# Patient Record
Sex: Male | Born: 1955 | Race: White | Hispanic: No | Marital: Married | Smoking: Former smoker
Health system: Southern US, Community
[De-identification: ages and names within clinical notes are randomized; demographics above are authoritative.]

## PROBLEM LIST (undated history)

## (undated) DIAGNOSIS — D759 Disease of blood and blood-forming organs, unspecified: Secondary | ICD-10-CM

## (undated) DIAGNOSIS — M1990 Unspecified osteoarthritis, unspecified site: Secondary | ICD-10-CM

## (undated) DIAGNOSIS — K219 Gastro-esophageal reflux disease without esophagitis: Secondary | ICD-10-CM

## (undated) DIAGNOSIS — Z5189 Encounter for other specified aftercare: Secondary | ICD-10-CM

## (undated) DIAGNOSIS — N189 Chronic kidney disease, unspecified: Secondary | ICD-10-CM

## (undated) DIAGNOSIS — C9000 Multiple myeloma not having achieved remission: Secondary | ICD-10-CM

## (undated) DIAGNOSIS — Z87442 Personal history of urinary calculi: Secondary | ICD-10-CM

## (undated) DIAGNOSIS — E7800 Pure hypercholesterolemia, unspecified: Secondary | ICD-10-CM

## (undated) DIAGNOSIS — C801 Malignant (primary) neoplasm, unspecified: Secondary | ICD-10-CM

## (undated) DIAGNOSIS — Z98811 Dental restoration status: Secondary | ICD-10-CM

## (undated) DIAGNOSIS — R200 Anesthesia of skin: Secondary | ICD-10-CM

## (undated) DIAGNOSIS — Z8719 Personal history of other diseases of the digestive system: Secondary | ICD-10-CM

## (undated) DIAGNOSIS — K5790 Diverticulosis of intestine, part unspecified, without perforation or abscess without bleeding: Secondary | ICD-10-CM

## (undated) HISTORY — PX: LUMBAR FUSION: SHX111

## (undated) HISTORY — DX: Multiple myeloma not having achieved remission: C90.00

## (undated) HISTORY — DX: Encounter for other specified aftercare: Z51.89

## (undated) HISTORY — PX: ROTATOR CUFF REPAIR: SHX139

## (undated) HISTORY — PX: ANTERIOR CRUCIATE LIGAMENT REPAIR: SHX115

## (undated) HISTORY — PX: BACK SURGERY: SHX140

## (undated) HISTORY — DX: Diverticulosis of intestine, part unspecified, without perforation or abscess without bleeding: K57.90

## (undated) HISTORY — PX: COLONOSCOPY: SHX174

## (undated) HISTORY — DX: Chronic kidney disease, unspecified: N18.9

---

## 1995-10-05 HISTORY — PX: CHOLECYSTECTOMY: SHX55

## 2006-10-04 HISTORY — PX: LUMBAR LAMINECTOMY: SHX95

## 2012-10-24 ENCOUNTER — Other Ambulatory Visit: Payer: Self-pay | Admitting: Neurosurgery

## 2012-11-28 ENCOUNTER — Encounter (HOSPITAL_COMMUNITY): Payer: Self-pay | Admitting: Pharmacy Technician

## 2012-12-01 ENCOUNTER — Encounter (HOSPITAL_COMMUNITY)
Admission: RE | Admit: 2012-12-01 | Discharge: 2012-12-01 | Disposition: A | Discharge status: Home / Self Care | Payer: BC Managed Care – PPO | Source: Ambulatory Visit | Attending: Neurosurgery | Admitting: Neurosurgery

## 2012-12-01 ENCOUNTER — Encounter (HOSPITAL_COMMUNITY): Payer: Self-pay

## 2012-12-01 HISTORY — DX: Dental restoration status: Z98.811

## 2012-12-01 HISTORY — DX: Gastro-esophageal reflux disease without esophagitis: K21.9

## 2012-12-01 HISTORY — DX: Anesthesia of skin: R20.0

## 2012-12-01 HISTORY — DX: Pure hypercholesterolemia, unspecified: E78.00

## 2012-12-01 LAB — BASIC METABOLIC PANEL
BUN: 12 mg/dL (ref 6–23)
Calcium: 9.6 mg/dL (ref 8.4–10.5)
Creatinine, Ser: 0.99 mg/dL (ref 0.50–1.35)
GFR calc non Af Amer: 88 mL/min — ABNORMAL LOW (ref >90)
Glucose, Bld: 83 mg/dL (ref 70–99)
Sodium: 143 mEq/L (ref 135–145)

## 2012-12-01 LAB — CBC
MCH: 33.3 pg (ref 26.0–34.0)
MCHC: 34.8 g/dL (ref 30.0–36.0)
MCV: 95.7 fL (ref 78.0–100.0)
Platelets: 163 10*3/uL (ref 150–400)
RDW: 12.8 % (ref 11.5–15.5)

## 2012-12-01 LAB — TYPE AND SCREEN: ABO/RH(D): O POS

## 2012-12-01 NOTE — Pre-Procedure Instructions (Signed)
[name]  12/01/2012   Your procedure is scheduled on:  Tuesday, March 11  Report to [facility] at 0530 AM.  Call this number if you have problems the morning of surgery: [phone]   Remember:   Do not eat food or drink liquids after midnight.Monday night   Take these medicines the morning of surgery with A SIP OF WATER: Ranitidine,Tramadol is needed   Do not wear jewelry, make-up or nail polish.  Do not wear lotions, powders, perfumes or deodorant.  Do not bring valuables to the hospital.  Contacts, dentures or bridgework may not be worn into surgery.  Leave suitcase in the car. After surgery it may be brought to your room.  For patients admitted to the hospital, checkout time is 11:00 AM the day of  discharge.   Special Instructions: Shower using CHG 2 nights before surgery and the night before surgery.  If you shower the day of surgery use CHG.  Use special wash - you have one bottle of CHG for all showers.  You should use approximately 1/3 of the bottle for each shower.   Please read over the following fact sheets that you were given: Pain Booklet, Coughing and Deep Breathing, Blood Transfusion Information and Surgical Site Infection Prevention

## 2012-12-01 NOTE — Progress Notes (Signed)
EKG requested from PCP Dr Romeo Rabon

## 2012-12-04 NOTE — Progress Notes (Signed)
Anesthesia chart review: Patient is a 57 year old male scheduled for L4-S1 decompression/fusion by Dr. [name] on 12/12/12. History includes nonsmoker, hypercholesterolemia, GERD, prior lumbar laminectomy, cholecystectomy, rotator cuff repair, dental crowns, known right bundle branch block.  PCP is Dr. Romeo Rabon.  Preoperative labs noted.  EKG from 02/18/12 (PCP) showed SB @ 49 bpm, right BBB (old, per notes).  Per PAT RN notes, he denied any prior cardiac testing.  No CV symptoms documented at his PAT appointment.  He has no known history of smoking, HTN, DM, or CAD.  HR was 76 bpm at PAT.  He will be evaluated by his assigned anesthesiologist on the day of surgery.  If no acute changes then would anticipate he could proceed as planned.   Shonna Chock, PA-C 12/04/12 1224

## 2012-12-08 ENCOUNTER — Other Ambulatory Visit (HOSPITAL_COMMUNITY): Payer: Self-pay

## 2012-12-11 MED ORDER — CEFAZOLIN SODIUM-DEXTROSE 2-3 GM-% IV SOLR
2.0000 g | INTRAVENOUS | Status: AC
  Administered 2012-12-12 (×2): 2 g via INTRAVENOUS
  Filled 2012-12-11: qty 50

## 2012-12-11 NOTE — H&P (Signed)
[name]  #[id number] DOB:  1955-11-06   10/23/2012:  [name] comes back today.  I spent greater than 25 minutes with the patient and then [name], RN went over the details of surgery and models, and answered questions in addition to my visit with him.    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  He has bilateral L5 pars defects.  He has Grade I anterolisthesis of L5 on S1, moderate right, milder left foraminal stenosis.  At L4-5 there is a right foraminal disc protrusion, moderate bilateral facet arthropathy, right greater than left lateral recess stenosis.  He has milder degenerative changes at other levels in his lumbar spine, but these are not significantly problematic.    He continues to have weakness in his right leg and also greater pain in his left leg.    I have recommended that he undergo decompression and fusion L4 through S1 levels.  He has significant weakness in his right leg involving extensor hallucis longus and hip abductor and this is unchanged.  He has mobile spondylolisthesis of L5 on S1 with Grade I to II spondylolisthesis with Grade I retrolisthesis of L4 on L5.  He has had previous laminectomy at L3-4, but this does not appear to be a problem and is not an active contributor to his current pain complaints.    He is not able to function and has tried exercises and physical therapy without significant relief of his discomfort.  At this point, I recommend that we proceed with decompression and fusion operation and have set this up for 12/12/2012.  Risks and benefits were discussed with the patient and he and his wife's questions were answered and they wish to proceed.   I went over the surgical procedure with the patient in great detail today.  I went over the diagnostic studies, as well as surgical models.  We discussed the potential risks and benefits of the surgery, as well as expected hospital course.  The  patient would generally wear a brace for three months after surgery.  The risks include, but are not limited to, bleeding, possible need for transfusion, infection, damage to nerves and vessels, risks of anesthesia, dural tear, injury to lumbar nerve roots causing either temporary or permanent leg pain, numbness or weakness, malpositioning of instrumentation, fusion failure, failure to relieve pain, back pain after surgery, recurrent disc herniation, worsening of pain and adjacent degeneration after a spinal fusion.  Also, the potential for the need for further surgery in the event of incomplete fusion.  We also discussed the risks of injury to abdominal structures including bowel, iliac artery or vein injury.           [name]. 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  He says his left leg is worse than his right.  He complains of pain into his left hip, groin and foot, and occasionally into his right leg and occasionally across his low back.  He also notes numbness and tingling in the right foot.  He says recently his right foot has begun to drag and according to his wife who is the head of the OR at  [facility] she thinks he has a foot drop.  He says this has been going on for the last 4-5 years but has worsened recently.  He has had previous surgery by Dr. [name] in 09/2007 which consists of a left L3-4 laminectomy.  He said he was better after surgery.  He has been taking Tramadol 50 mg. which is a 59 year old prescription. He occasionally takes  tablet and takes 1-2 per week at night.  He has had epidural steroid injections in the past by Dr. [name] two years ago but did not help him.  He has had right rotator cuff surgery in  2001 and left shoulder decompression.  He also has had bilateral carpal tunnel releases and right knee surgery and a cholecystectomy in 04/2006.   He complains of pain with sitting, sleeping or with standing.    REVIEW OF SYSTEMS:   A detailed Review of Systems sheet was reviewed with the patient.  Pertinent positives include: Eyes-glasses; Musculoskeletal-back pain, leg pain; Neurological-problems with coordination in legs.  All other systems are negative; this includes Constitutional symptoms, Cardiovascular, Ears, nose, mouth, throat, Endocrine, Respiratory, Gastrointestinal, Genitourinary, Integumentary & Breast, Psychiatric, Hematologic/Lymphatic, and Allergic/Immunologic.    PAST MEDICAL HISTORY:      Current Medical Conditions:    He has gastroesophageal reflux disease and elevated cholesterol.  Also as previously described.     Prior Operations and Hospitalizations:   As previously described.     Medications and Allergies:   Atorvastatin 10 mg. q.o.d., Ranitidine 75 mg. q.d., aspirin 81 mg. q.d., and Tramadol 50 mg. p.r.n.  No known drug allergies.      Height and Weight:     6'2" tall, 180 pounds.  BMI is 23.     FAMILY HISTORY:    Parents are deceased.  Mother died at age 86 with hypertension and Alzheimer's.  Father died at age 10 of cancer.     SOCIAL HISTORY:    He denies tobacco or drug use.  He is a social drinker of alcoholic beverages.       DIAGNOSTIC STUDIES:   His last MRI was preoperatively in 2008.  Lumbar radiographs obtained in the office today demonstrate some disc degeneration L5-S1 and 4-5 levels with 5 well aligned lumbar vertebrae in the AP projection.  There is significant retrolisthesis of L4 on L5 with compression of the foramen at this level by superior articular process of L5 and also anterolisthesis at L5 on S1.  There is milder retrolisthesis of L3 on L4 and of L2 on L3. There is disc degeneration at each of these levels.  On flexion, the patient still has mild  retrolisthesis of L2 on L3 and L3 on L4, severe retrolisthesis of L4 on L5 and anterolisthesis of L5 on S1.  This persists through flexion and extension views.     PHYSICAL EXAMINATION:      General Appearance:   On examination today, Mr. Nancarrow is a pleasant and cooperative man in no acute distress.       Blood Pressure, Pulse:     124/66.  Heart rate 64 and regular, respirations 16.       HEENT - normocephalic, atraumatic.  The pupils are equal, round and reactive to light.  The extraocular muscles are intact.  Sclerae - white.  Conjunctiva - pink.  Oropharynx benign.  Uvula midline.     Neck - there are no masses, meningismus, deformities, tracheal deviation, jugular vein distention or carotid bruits.  There is normal cervical range of motion.  Spurlings' test is negative without reproducible radicular pain turning the patient's head to either side.  Lhermitte's sign is not present with axial compression.      Respiratory - there is normal respiratory effort with good intercostal function.  Lungs are clear to auscultation.  There are no rales, rhonchi or wheezes.      Cardiovascular - the heart has regular rate and rhythm to auscultation.  No murmurs are appreciated.  There is no extremity edema, cyanosis or clubbing.  There are palpable pedal pulses.      Abdomen - soft, nontender, no hepatosplenomegaly appreciated or masses.  There are active bowel sounds.  No guarding or rebound.      Musculoskeletal Examination - he is able to bend to within 4 inches of the floor with his upper extremities outstretched.  He has a well healed midline lumbar incision.  He is able to stand on his heels and toes and is able to squat on either leg independently but appears to have some diminished ability to do so on the right greater than right.  He denies significant sciatic notch discomfort to palpation.  Straight leg raise is positive at 45 degrees on the right.    NEUROLOGICAL EXAMINATION:  The patient is  oriented to time, person and place and has good recall of both recent and remote memory with normal attention span and concentration.  The patient speaks with clear and fluent speech and exhibits normal language function and appropriate fund of knowledge.      Cranial Nerve Examination - pupils are equal, round and reactive to light.  Extraocular movements are full.  Visual fields are full to confrontational testing.  Facial sensation and facial movement are symmetric and intact.  Hearing is intact to finger rub.  Palate is upgoing.  Shoulder shrug is symmetric.  Tongue protrudes in the midline.      Motor Examination - motor strength is 5/5 in the bilateral deltoids, biceps, triceps, handgrips, wrist extensors, interosseous.  In the lower extremities motor strength is 5/5 in hip flexion, extension, quadriceps, hamstrings, plantar flexion, dorsiflexion and extensor hallucis longus with the exception of 4/5 right extensor hallucis longus strength and 4/5 hip abductor strength.    Sensory Examination - he has decreased pin sensation in the right L5 and S1 distributions.      Deep Tendon Reflexes - 2 in the biceps, triceps, and brachioradialis, 2 in the knees, 1 in the right ankles and 2 at the left ankle.  The great toes are downgoing to plantar stimulation.  No pathologic reflexes.       Cerebellar Examination - normal coordination in upper and lower extremities and normal rapid alternating movements.  Romberg test is negative.    IMPRESSION AND RECOMMENDATIONS: Adolf Ormiston is a 57 year old man with significant right leg weakness without a true foot drop but he has hip abductor weakness.  I have recommended that he have an MRI done of his lumbar spine and I will have him come and do that in [place] on the same day as I see him so that he does not have to make another trip.  I do think he has significant weakness and will likely require intervention for this.  I will make further recommendations after  I have the opportunity to review his MRI with him.  Prescription for Tramadol was written.    [facility]    [name]. [name], M.D.

## 2012-12-12 ENCOUNTER — Ambulatory Visit (HOSPITAL_COMMUNITY): Payer: BC Managed Care – PPO | Admitting: Certified Registered"

## 2012-12-12 ENCOUNTER — Inpatient Hospital Stay (HOSPITAL_COMMUNITY)
Admission: RE | Admit: 2012-12-12 | Discharge: 2012-12-14 | DRG: 756 | Disposition: A | Discharge status: Home Health | Payer: BC Managed Care – PPO | Source: Ambulatory Visit | Attending: Neurosurgery | Admitting: Neurosurgery

## 2012-12-12 ENCOUNTER — Encounter (HOSPITAL_COMMUNITY)
Admission: RE | Disposition: A | Discharge status: Home Health | Payer: Self-pay | Source: Ambulatory Visit | Attending: Neurosurgery

## 2012-12-12 ENCOUNTER — Ambulatory Visit (HOSPITAL_COMMUNITY): Payer: BC Managed Care – PPO

## 2012-12-12 ENCOUNTER — Encounter (HOSPITAL_COMMUNITY): Payer: Self-pay | Admitting: Vascular Surgery

## 2012-12-12 ENCOUNTER — Encounter (HOSPITAL_COMMUNITY): Payer: Self-pay

## 2012-12-12 DIAGNOSIS — E7800 Pure hypercholesterolemia, unspecified: Secondary | ICD-10-CM | POA: Diagnosis present

## 2012-12-12 DIAGNOSIS — Q762 Congenital spondylolisthesis: Secondary | ICD-10-CM

## 2012-12-12 DIAGNOSIS — Z7982 Long term (current) use of aspirin: Secondary | ICD-10-CM

## 2012-12-12 DIAGNOSIS — K219 Gastro-esophageal reflux disease without esophagitis: Secondary | ICD-10-CM | POA: Diagnosis present

## 2012-12-12 DIAGNOSIS — Z01812 Encounter for preprocedural laboratory examination: Secondary | ICD-10-CM

## 2012-12-12 DIAGNOSIS — M47817 Spondylosis without myelopathy or radiculopathy, lumbosacral region: Principal | ICD-10-CM | POA: Diagnosis present

## 2012-12-12 DIAGNOSIS — Z79899 Other long term (current) drug therapy: Secondary | ICD-10-CM

## 2012-12-12 SURGERY — POSTERIOR LUMBAR FUSION 2 LEVEL
Anesthesia: General | Site: Back | Wound class: Clean

## 2012-12-12 MED ORDER — ACETAMINOPHEN 650 MG RE SUPP: 650.0000 mg | RECTAL | Status: DC | PRN

## 2012-12-12 MED ORDER — ACETAMINOPHEN 325 MG PO TABS
650.0000 mg | ORAL_TABLET | ORAL | Status: DC | PRN
  Administered 2012-12-13 – 2012-12-14 (×2): 650 mg via ORAL
  Filled 2012-12-12 (×2): qty 2

## 2012-12-12 MED ORDER — CEFAZOLIN SODIUM-DEXTROSE 2-3 GM-% IV SOLR
INTRAVENOUS | Status: AC
  Filled 2012-12-12: qty 50

## 2012-12-12 MED ORDER — ACETAMINOPHEN 10 MG/ML IV SOLN
INTRAVENOUS | Status: AC
  Administered 2012-12-12: 1000 mg via INTRAVENOUS
  Filled 2012-12-12: qty 100

## 2012-12-12 MED ORDER — SODIUM CHLORIDE 0.9 % IV SOLN
INTRAVENOUS | Status: DC | PRN
  Administered 2012-12-12: 11:00:00 via INTRAVENOUS

## 2012-12-12 MED ORDER — TRAMADOL 5 MG/ML ORAL SUSPENSION
12.5000 mg | Freq: Four times a day (QID) | ORAL | Status: DC | PRN
  Filled 2012-12-12: qty 2.5

## 2012-12-12 MED ORDER — ALBUMIN HUMAN 5 % IV SOLN
INTRAVENOUS | Status: DC | PRN
  Administered 2012-12-12: 10:00:00 via INTRAVENOUS

## 2012-12-12 MED ORDER — MENTHOL 3 MG MT LOZG: 1.0000 | LOZENGE | OROMUCOSAL | Status: DC | PRN

## 2012-12-12 MED ORDER — PHENOL 1.4 % MT LIQD: 1.0000 | OROMUCOSAL | Status: DC | PRN

## 2012-12-12 MED ORDER — SODIUM CHLORIDE 0.9 % IJ SOLN
3.0000 mL | Freq: Two times a day (BID) | INTRAMUSCULAR | Status: DC
  Administered 2012-12-13: 3 mL via INTRAVENOUS

## 2012-12-12 MED ORDER — DOCUSATE SODIUM 100 MG PO CAPS
100.0000 mg | ORAL_CAPSULE | Freq: Two times a day (BID) | ORAL | Status: DC
  Administered 2012-12-12 – 2012-12-14 (×5): 100 mg via ORAL
  Filled 2012-12-12 (×3): qty 1

## 2012-12-12 MED ORDER — BUPIVACAINE HCL (PF) 0.5 % IJ SOLN
INTRAMUSCULAR | Status: DC | PRN
  Administered 2012-12-12: 5 mL

## 2012-12-12 MED ORDER — BUPIVACAINE LIPOSOME 1.3 % IJ SUSP
INTRAMUSCULAR | Status: DC | PRN
  Administered 2012-12-12: 20 mL

## 2012-12-12 MED ORDER — ROCURONIUM BROMIDE 100 MG/10ML IV SOLN
INTRAVENOUS | Status: DC | PRN
  Administered 2012-12-12: 10 mg via INTRAVENOUS
  Administered 2012-12-12: 50 mg via INTRAVENOUS
  Administered 2012-12-12 (×4): 10 mg via INTRAVENOUS

## 2012-12-12 MED ORDER — SODIUM CHLORIDE 0.9 % IV SOLN: 250.0000 mL | INTRAVENOUS | Status: DC

## 2012-12-12 MED ORDER — TRAMADOL HCL 50 MG PO TABS: 12.5000 mg | ORAL_TABLET | Freq: Four times a day (QID) | ORAL | Status: DC | PRN

## 2012-12-12 MED ORDER — PHENYLEPHRINE HCL 10 MG/ML IJ SOLN
INTRAMUSCULAR | Status: DC | PRN
  Administered 2012-12-12 (×7): 40 ug via INTRAVENOUS

## 2012-12-12 MED ORDER — PROPOFOL 10 MG/ML IV BOLUS
INTRAVENOUS | Status: DC | PRN
  Administered 2012-12-12: 200 mg via INTRAVENOUS

## 2012-12-12 MED ORDER — ONDANSETRON HCL 4 MG/2ML IJ SOLN
4.0000 mg | Freq: Four times a day (QID) | INTRAMUSCULAR | Status: DC | PRN
  Filled 2012-12-12: qty 2

## 2012-12-12 MED ORDER — LACTATED RINGERS IV SOLN
INTRAVENOUS | Status: DC | PRN
  Administered 2012-12-12 (×3): via INTRAVENOUS

## 2012-12-12 MED ORDER — ATORVASTATIN CALCIUM 10 MG PO TABS
10.0000 mg | ORAL_TABLET | ORAL | Status: DC
  Filled 2012-12-12: qty 1

## 2012-12-12 MED ORDER — FENTANYL CITRATE 0.05 MG/ML IJ SOLN
INTRAMUSCULAR | Status: DC | PRN
  Administered 2012-12-12 (×2): 100 ug via INTRAVENOUS
  Administered 2012-12-12: 50 ug via INTRAVENOUS

## 2012-12-12 MED ORDER — 0.9 % SODIUM CHLORIDE (POUR BTL) OPTIME
TOPICAL | Status: DC | PRN
  Administered 2012-12-12: 1000 mL

## 2012-12-12 MED ORDER — BISACODYL 10 MG RE SUPP: 10.0000 mg | Freq: Every day | RECTAL | Status: DC | PRN

## 2012-12-12 MED ORDER — HYDROMORPHONE HCL PF 1 MG/ML IJ SOLN
0.2500 mg | INTRAMUSCULAR | Status: DC | PRN
  Administered 2012-12-12 (×4): 0.5 mg via INTRAVENOUS

## 2012-12-12 MED ORDER — BUPIVACAINE LIPOSOME 1.3 % IJ SUSP
20.0000 mL | INTRAMUSCULAR | Status: DC
  Filled 2012-12-12: qty 20

## 2012-12-12 MED ORDER — DIPHENHYDRAMINE HCL 50 MG/ML IJ SOLN: 12.5000 mg | Freq: Four times a day (QID) | INTRAMUSCULAR | Status: DC | PRN

## 2012-12-12 MED ORDER — ATORVASTATIN CALCIUM 10 MG PO TABS
10.0000 mg | ORAL_TABLET | ORAL | Status: DC
  Administered 2012-12-13: 10 mg via ORAL
  Filled 2012-12-12: qty 1

## 2012-12-12 MED ORDER — ONDANSETRON HCL 4 MG/2ML IJ SOLN
INTRAMUSCULAR | Status: DC | PRN
  Administered 2012-12-12: 4 mg via INTRAVENOUS

## 2012-12-12 MED ORDER — LIDOCAINE-EPINEPHRINE 1 %-1:100000 IJ SOLN
INTRAMUSCULAR | Status: DC | PRN
  Administered 2012-12-12: 5 mL

## 2012-12-12 MED ORDER — FAMOTIDINE 20 MG PO TABS
20.0000 mg | ORAL_TABLET | Freq: Two times a day (BID) | ORAL | Status: DC
  Administered 2012-12-12 – 2012-12-14 (×4): 20 mg via ORAL
  Filled 2012-12-12 (×5): qty 1

## 2012-12-12 MED ORDER — SENNOSIDES-DOCUSATE SODIUM 8.6-50 MG PO TABS
1.0000 | ORAL_TABLET | Freq: Every evening | ORAL | Status: DC | PRN
  Administered 2012-12-12: 1 via ORAL
  Filled 2012-12-12: qty 1

## 2012-12-12 MED ORDER — SODIUM CHLORIDE 0.9 % IJ SOLN: 3.0000 mL | INTRAMUSCULAR | Status: DC | PRN

## 2012-12-12 MED ORDER — FLEET ENEMA 7-19 GM/118ML RE ENEM: 1.0000 | ENEMA | Freq: Once | RECTAL | Status: AC | PRN

## 2012-12-12 MED ORDER — ONDANSETRON HCL 4 MG/2ML IJ SOLN
4.0000 mg | INTRAMUSCULAR | Status: DC | PRN
  Administered 2012-12-12: 4 mg via INTRAVENOUS

## 2012-12-12 MED ORDER — THROMBIN 20000 UNITS EX SOLR
CUTANEOUS | Status: DC | PRN
  Administered 2012-12-12: 09:00:00 via TOPICAL

## 2012-12-12 MED ORDER — CEFAZOLIN SODIUM 1-5 GM-% IV SOLN
1.0000 g | Freq: Three times a day (TID) | INTRAVENOUS | Status: AC
  Administered 2012-12-12 – 2012-12-13 (×2): 1 g via INTRAVENOUS
  Filled 2012-12-12 (×2): qty 50

## 2012-12-12 MED ORDER — EPHEDRINE SULFATE 50 MG/ML IJ SOLN
INTRAMUSCULAR | Status: DC | PRN
  Administered 2012-12-12: 5 mg via INTRAVENOUS
  Administered 2012-12-12: 10 mg via INTRAVENOUS
  Administered 2012-12-12 (×2): 5 mg via INTRAVENOUS
  Administered 2012-12-12: 15 mg via INTRAVENOUS
  Administered 2012-12-12 (×2): 5 mg via INTRAVENOUS

## 2012-12-12 MED ORDER — ALUM & MAG HYDROXIDE-SIMETH 200-200-20 MG/5ML PO SUSP: 30.0000 mL | Freq: Four times a day (QID) | ORAL | Status: DC | PRN

## 2012-12-12 MED ORDER — ZOLPIDEM TARTRATE 5 MG PO TABS: 5.0000 mg | ORAL_TABLET | Freq: Every evening | ORAL | Status: DC | PRN

## 2012-12-12 MED ORDER — MORPHINE SULFATE (PF) 1 MG/ML IV SOLN
INTRAVENOUS | Status: DC
  Administered 2012-12-12: 13:00:00 via INTRAVENOUS
  Administered 2012-12-12: 6 mg via INTRAVENOUS
  Administered 2012-12-13: 02:00:00 via INTRAVENOUS
  Administered 2012-12-13: 1.5 mg via INTRAVENOUS
  Filled 2012-12-12: qty 25

## 2012-12-12 MED ORDER — DIPHENHYDRAMINE HCL 12.5 MG/5ML PO ELIX: 12.5000 mg | ORAL_SOLUTION | Freq: Four times a day (QID) | ORAL | Status: DC | PRN

## 2012-12-12 MED ORDER — SODIUM CHLORIDE 0.9 % IJ SOLN: 9.0000 mL | INTRAMUSCULAR | Status: DC | PRN

## 2012-12-12 MED ORDER — SENNA 8.6 MG PO TABS
1.0000 | ORAL_TABLET | Freq: Two times a day (BID) | ORAL | Status: DC
  Administered 2012-12-13 – 2012-12-14 (×3): 8.6 mg via ORAL
  Filled 2012-12-12 (×4): qty 1

## 2012-12-12 MED ORDER — NALOXONE HCL 0.4 MG/ML IJ SOLN: 0.4000 mg | INTRAMUSCULAR | Status: DC | PRN

## 2012-12-12 MED ORDER — ONDANSETRON HCL 4 MG/2ML IJ SOLN: 4.0000 mg | Freq: Once | INTRAMUSCULAR | Status: DC | PRN

## 2012-12-12 MED ORDER — MORPHINE SULFATE (PF) 1 MG/ML IV SOLN
INTRAVENOUS | Status: AC
  Filled 2012-12-12: qty 25

## 2012-12-12 MED ORDER — HYDROMORPHONE HCL PF 1 MG/ML IJ SOLN
INTRAMUSCULAR | Status: AC
  Filled 2012-12-12: qty 1

## 2012-12-12 MED ORDER — NEOSTIGMINE METHYLSULFATE 1 MG/ML IJ SOLN
INTRAMUSCULAR | Status: DC | PRN
  Administered 2012-12-12: 4 mg via INTRAVENOUS

## 2012-12-12 MED ORDER — LIDOCAINE HCL 4 % MT SOLN
OROMUCOSAL | Status: DC | PRN
  Administered 2012-12-12: 4 mL via TOPICAL

## 2012-12-12 MED ORDER — ARTIFICIAL TEARS OP OINT
TOPICAL_OINTMENT | OPHTHALMIC | Status: DC | PRN
  Administered 2012-12-12: 1 via OPHTHALMIC

## 2012-12-12 MED ORDER — HYDROCODONE-ACETAMINOPHEN 5-325 MG PO TABS: 1.0000 | ORAL_TABLET | ORAL | Status: DC | PRN

## 2012-12-12 MED ORDER — LIDOCAINE HCL (CARDIAC) 20 MG/ML IV SOLN
INTRAVENOUS | Status: DC | PRN
  Administered 2012-12-12: 100 mg via INTRAVENOUS

## 2012-12-12 MED ORDER — OXYCODONE-ACETAMINOPHEN 5-325 MG PO TABS
1.0000 | ORAL_TABLET | ORAL | Status: DC | PRN
  Administered 2012-12-13: 1 via ORAL
  Administered 2012-12-13: 2 via ORAL
  Administered 2012-12-13 – 2012-12-14 (×3): 1 via ORAL
  Filled 2012-12-12 (×2): qty 1
  Filled 2012-12-12: qty 2
  Filled 2012-12-12 (×2): qty 1

## 2012-12-12 MED ORDER — DIAZEPAM 5 MG PO TABS
5.0000 mg | ORAL_TABLET | Freq: Four times a day (QID) | ORAL | Status: DC | PRN
  Administered 2012-12-13 – 2012-12-14 (×3): 5 mg via ORAL
  Filled 2012-12-12 (×4): qty 1

## 2012-12-12 MED ORDER — GLYCOPYRROLATE 0.2 MG/ML IJ SOLN
INTRAMUSCULAR | Status: DC | PRN
  Administered 2012-12-12: 0.6 mg via INTRAVENOUS

## 2012-12-12 MED ORDER — KCL IN DEXTROSE-NACL 20-5-0.45 MEQ/L-%-% IV SOLN
INTRAVENOUS | Status: AC
  Filled 2012-12-12: qty 1000

## 2012-12-12 MED ORDER — KCL IN DEXTROSE-NACL 20-5-0.45 MEQ/L-%-% IV SOLN
INTRAVENOUS | Status: DC
  Administered 2012-12-12 – 2012-12-13 (×2): via INTRAVENOUS
  Filled 2012-12-12 (×5): qty 1000

## 2012-12-12 SURGICAL SUPPLY — 82 items
BAG DECANTER FOR FLEXI CONT (MISCELLANEOUS) ×2 IMPLANT
BENZOIN TINCTURE PRP APPL 2/3 (GAUZE/BANDAGES/DRESSINGS) ×2 IMPLANT
BLADE SURG ROTATE 9660 (MISCELLANEOUS) IMPLANT
BONE VOID FILLER STRIP 10CC (Bone Implant) ×2 IMPLANT
BUR MATCHSTICK NEURO 3.0 LAGG (BURR) ×2 IMPLANT
BUR PRECISION FLUTE 5.0 (BURR) ×2 IMPLANT
CANISTER SUCTION 2500CC (MISCELLANEOUS) ×2 IMPLANT
CLOTH BEACON ORANGE TIMEOUT ST (SAFETY) ×2 IMPLANT
CONT SPEC 4OZ CLIKSEAL STRL BL (MISCELLANEOUS) ×4 IMPLANT
COVER BACK TABLE 24X17X13 BIG (DRAPES) IMPLANT
COVER TABLE BACK 60X90 (DRAPES) ×2 IMPLANT
DERMABOND ADVANCED (GAUZE/BANDAGES/DRESSINGS) ×1
DERMABOND ADVANCED .7 DNX12 (GAUZE/BANDAGES/DRESSINGS) ×1 IMPLANT
DRAPE C-ARM 42X72 X-RAY (DRAPES) ×4 IMPLANT
DRAPE LAPAROTOMY 100X72X124 (DRAPES) ×2 IMPLANT
DRAPE POUCH INSTRU U-SHP 10X18 (DRAPES) ×2 IMPLANT
DRAPE SURG 17X23 STRL (DRAPES) ×2 IMPLANT
DRESSING TELFA 8X3 (GAUZE/BANDAGES/DRESSINGS) ×2 IMPLANT
DURAPREP 26ML APPLICATOR (WOUND CARE) ×2 IMPLANT
ELECT REM PT RETURN 9FT ADLT (ELECTROSURGICAL) ×2
ELECTRODE REM PT RTRN 9FT ADLT (ELECTROSURGICAL) ×1 IMPLANT
EVACUATOR 1/8 PVC DRAIN (DRAIN) ×2 IMPLANT
GAUZE SPONGE 4X4 16PLY XRAY LF (GAUZE/BANDAGES/DRESSINGS) IMPLANT
GLOVE BIO SURGEON STRL SZ8 (GLOVE) ×4 IMPLANT
GLOVE BIOGEL PI IND STRL 7.0 (GLOVE) ×2 IMPLANT
GLOVE BIOGEL PI IND STRL 7.5 (GLOVE) ×1 IMPLANT
GLOVE BIOGEL PI IND STRL 8 (GLOVE) ×2 IMPLANT
GLOVE BIOGEL PI IND STRL 8.5 (GLOVE) ×2 IMPLANT
GLOVE BIOGEL PI INDICATOR 7.0 (GLOVE) ×2
GLOVE BIOGEL PI INDICATOR 7.5 (GLOVE) ×1
GLOVE BIOGEL PI INDICATOR 8 (GLOVE) ×2
GLOVE BIOGEL PI INDICATOR 8.5 (GLOVE) ×2
GLOVE ECLIPSE 8.0 STRL XLNG CF (GLOVE) ×4 IMPLANT
GLOVE EXAM NITRILE LRG STRL (GLOVE) ×4 IMPLANT
GLOVE EXAM NITRILE MD LF STRL (GLOVE) IMPLANT
GLOVE EXAM NITRILE XL STR (GLOVE) IMPLANT
GLOVE EXAM NITRILE XS STR PU (GLOVE) IMPLANT
GLOVE SURG SS PI 6.5 STRL IVOR (GLOVE) ×4 IMPLANT
GLOVE SURG SS PI 7.0 STRL IVOR (GLOVE) ×6 IMPLANT
GOWN BRE IMP SLV AUR LG STRL (GOWN DISPOSABLE) IMPLANT
GOWN BRE IMP SLV AUR XL STRL (GOWN DISPOSABLE) ×6 IMPLANT
GOWN STRL REIN 2XL LVL4 (GOWN DISPOSABLE) IMPLANT
KIT BASIN OR (CUSTOM PROCEDURE TRAY) ×2 IMPLANT
KIT INFUSE SMALL (Orthopedic Implant) ×2 IMPLANT
KIT POSITION SURG JACKSON T1 (MISCELLANEOUS) ×2 IMPLANT
KIT ROOM TURNOVER OR (KITS) ×2 IMPLANT
MILL MEDIUM DISP (BLADE) ×2 IMPLANT
NEEDLE HYPO 21X1.5 SAFETY (NEEDLE) ×2 IMPLANT
NEEDLE HYPO 25X1 1.5 SAFETY (NEEDLE) ×2 IMPLANT
NEEDLE SPNL 18GX3.5 QUINCKE PK (NEEDLE) IMPLANT
NS IRRIG 1000ML POUR BTL (IV SOLUTION) ×2 IMPLANT
PACK LAMINECTOMY NEURO (CUSTOM PROCEDURE TRAY) ×2 IMPLANT
PAD ARMBOARD 7.5X6 YLW CONV (MISCELLANEOUS) ×6 IMPLANT
PATTIES SURGICAL .5 X.5 (GAUZE/BANDAGES/DRESSINGS) IMPLANT
PATTIES SURGICAL .5 X1 (DISPOSABLE) IMPLANT
PATTIES SURGICAL 1X1 (DISPOSABLE) IMPLANT
PEEK PLIF NOVEL 9X25X10 (Peek) ×4 IMPLANT
PEEK PLIF NOVEL 9X25X8MM (Peek) ×4 IMPLANT
ROD TI 5.5MM 6CM (Rod) ×4 IMPLANT
SCREW 50MM (Screw) ×4 IMPLANT
SCREW 55MM (Screw) ×4 IMPLANT
SCREW SET (Screw) ×12 IMPLANT
SCREW SPINAL 6.5MMX50MM (Screw) ×4 IMPLANT
SPONGE GAUZE 4X4 12PLY (GAUZE/BANDAGES/DRESSINGS) ×2 IMPLANT
SPONGE LAP 4X18 X RAY DECT (DISPOSABLE) IMPLANT
SPONGE SURGIFOAM ABS GEL 100 (HEMOSTASIS) ×2 IMPLANT
STAPLER SKIN PROX WIDE 3.9 (STAPLE) IMPLANT
STRIP CLOSURE SKIN 1/2X4 (GAUZE/BANDAGES/DRESSINGS) ×2 IMPLANT
SUT VIC AB 1 CT1 18XBRD ANBCTR (SUTURE) ×2 IMPLANT
SUT VIC AB 1 CT1 8-18 (SUTURE) ×2
SUT VIC AB 2-0 CT1 18 (SUTURE) ×4 IMPLANT
SUT VIC AB 3-0 SH 8-18 (SUTURE) ×4 IMPLANT
SYR 20CC LL (SYRINGE) ×2 IMPLANT
SYR 20ML ECCENTRIC (SYRINGE) ×2 IMPLANT
SYR 3ML LL SCALE MARK (SYRINGE) ×8 IMPLANT
SYR 5ML LL (SYRINGE) ×4 IMPLANT
TAPE CLOTH SURG 4X10 WHT LF (GAUZE/BANDAGES/DRESSINGS) ×2 IMPLANT
TOWEL OR 17X24 6PK STRL BLUE (TOWEL DISPOSABLE) ×2 IMPLANT
TOWEL OR 17X26 10 PK STRL BLUE (TOWEL DISPOSABLE) ×2 IMPLANT
TRAP SPECIMEN MUCOUS 40CC (MISCELLANEOUS) ×2 IMPLANT
TRAY FOLEY CATH 14FRSI W/METER (CATHETERS) ×2 IMPLANT
WATER STERILE IRR 1000ML POUR (IV SOLUTION) ×2 IMPLANT

## 2012-12-12 NOTE — Anesthesia Preprocedure Evaluation (Addendum)
Anesthesia Evaluation  Patient identified by MRN, date of birth, ID band Patient awake    Reviewed: Allergy & Precautions, H&P , NPO status , Patient's Chart, lab work & pertinent test results  Airway Mallampati: I TM Distance: >3 FB Neck ROM: Full    Dental  (+) Teeth Intact and Dental Advisory Given   Pulmonary          Cardiovascular + dysrhythmias Rhythm:Regular Rate:Normal     Neuro/Psych    GI/Hepatic GERD-  Medicated and Controlled,  Endo/Other    Renal/GU      Musculoskeletal   Abdominal   Peds  Hematology   Anesthesia Other Findings   Reproductive/Obstetrics                          Anesthesia Physical Anesthesia Plan  ASA: II  Anesthesia Plan: General   Post-op Pain Management:    Induction: Intravenous  Airway Management Planned: Oral ETT  Additional Equipment:   Intra-op Plan:   Post-operative Plan: Extubation in OR  Informed Consent: I have reviewed the patients History and Physical, chart, labs and discussed the procedure including the risks, benefits and alternatives for the proposed anesthesia with the patient or authorized representative who has indicated his/her understanding and acceptance.     Plan Discussed with: CRNA, Anesthesiologist and Surgeon  Anesthesia Plan Comments:         Anesthesia Quick Evaluation

## 2012-12-12 NOTE — Transfer of Care (Signed)
Immediate Anesthesia Transfer of Care Note  Patient: [name]  Procedure(s) Performed: Procedure(s) with comments: POSTERIOR LUMBAR FUSION 2 LEVEL (N/A) - Lumbar four to -Sacral one Decompression/Fusion  Patient Location: PACU  Anesthesia Type:General  Level of Consciousness: awake, alert  and patient cooperative  Airway & Oxygen Therapy: Patient Spontanous Breathing and Patient connected to nasal cannula oxygen  Post-op Assessment: Report given to PACU RN and Patient moving all extremities X 4  Post vital signs: Reviewed and stable  Complications: No apparent anesthesia complications

## 2012-12-12 NOTE — Anesthesia Procedure Notes (Signed)
Procedure Name: Intubation Date/Time: 12/12/2012 7:36 AM Performed by: Jefm Miles E Pre-anesthesia Checklist: Patient identified, Timeout performed, Emergency Drugs available, Suction available and Patient being monitored Patient Re-evaluated:Patient Re-evaluated prior to inductionOxygen Delivery Method: Circle system utilized Intubation Type: IV induction Ventilation: Mask ventilation without difficulty Laryngoscope Size: Mac and 3 Grade View: Grade II Tube type: Oral Tube size: 7.5 mm Number of attempts: 1 Airway Equipment and Method: Stylet and LTA kit utilized Placement Confirmation: ETT inserted through vocal cords under direct vision,  breath sounds checked- equal and bilateral and positive ETCO2 Secured at: 23 cm Tube secured with: Tape Dental Injury: Teeth and Oropharynx as per pre-operative assessment

## 2012-12-12 NOTE — Interval H&P Note (Signed)
History and Physical Interval Note:  12/12/2012 5:35 AM  [name]  has presented today for surgery, with the diagnosis of Spondylolisthesis, Congenital spondylosis, Lumbar stenosis, Lumbar radiculopathy  The various methods of treatment have been discussed with the patient and family. After consideration of risks, benefits and other options for treatment, the patient has consented to  Procedure(s) with comments: POSTERIOR LUMBAR FUSION 2 LEVEL (N/A) - L4-S1 Decompression/Fusion as a surgical intervention .  The patient's history has been reviewed, patient examined, no change in status, stable for surgery.  I have reviewed the patient's chart and labs.  Questions were answered to the patient's satisfaction.     STERN,JOSEPH D

## 2012-12-12 NOTE — Anesthesia Postprocedure Evaluation (Signed)
  Anesthesia Post-op Note  Patient: [name]  Procedure(s) Performed: Procedure(s) with comments: POSTERIOR LUMBAR FUSION 2 LEVEL (N/A) - Lumbar four to -Sacral one Decompression/Fusion  Patient Location: PACU  Anesthesia Type:General  Level of Consciousness: awake, alert , oriented and patient cooperative  Airway and Oxygen Therapy: Patient Spontanous Breathing  Post-op Pain: mild  Post-op Assessment: Post-op Vital signs reviewed, Patient's Cardiovascular Status Stable, Respiratory Function Stable, Patent Airway, No signs of Nausea or vomiting and Pain level controlled  Post-op Vital Signs: stable  Complications: No apparent anesthesia complications

## 2012-12-12 NOTE — Progress Notes (Signed)
Awake, alert, conversant.  Full strength both legs, some left thigh discomfort, no numbness.

## 2012-12-12 NOTE — Addendum Note (Signed)
Addendum created 12/12/12 1548 by De Nurse, CRNA   Modules edited: Anesthesia Flowsheet

## 2012-12-12 NOTE — Op Note (Signed)
12/12/2012  12:43 PM  PATIENT:  [name]  57 y.o. male  PRE-OPERATIVE DIAGNOSIS:  Spondylolisthesis, Congenital spondylolysis, Lumbar stenosis, Lumbar radiculopathy L45 and L5S1  POST-OPERATIVE DIAGNOSIS:  Spondylolisthesis, Congenital spondylolysis, Lumbar stenosis, Lumbar radiculopathy L45 and L5S1  PROCEDURE:  Procedure(s) with comments: POSTERIOR LUMBAR FUSION 2 LEVEL (N/A) - Lumbar four to -Sacral one Decompression/Fusion L5 Gill, L4/5 Decompression, L45 and L5 S1 PLIF with autograft and allograft, pedicle screw fixation L 4 - S 1 levels with posterolateral arthrodesis.  SURGEON:  Surgeon(s) and Role:    * Joseph Stern, MD - Primary    * James R Hirsch, MD - Assisting  PHYSICIAN ASSISTANT:   ASSISTANTS: Poteat, RN   ANESTHESIA:   general  EBL:  Total I/O In: 2450 [I.V.:2100; Blood:100; IV Piggyback:250] Out: 585 [Urine:285; Blood:300]  BLOOD ADMINISTERED:100 CC CELLSAVER  DRAINS: (Medium) Hemovact drain(s) in the epidural space with  Suction Open   LOCAL MEDICATIONS USED:  LIDOCAINE   SPECIMEN:  No Specimen  DISPOSITION OF SPECIMEN:  N/A  COUNTS:  YES  TOURNIQUET:  * No tourniquets in log *  DICTATION: DICTATION: Patient is 57-year-old man with spondylolysis L 5  wilth mobile spondylolisthesis of L5 on S1 and retrololisthesis and severe stenosis L 45. He has severe bilateral L5 radiculopathy. It was elected to take him to surgery for decompression and fusion at L45 and L5S1 levels.   Procedure: Patient was placed in a prone position on the Jackson table after smooth and uncomplicated induction of general endotracheal anesthesia. His low back was prepped and draped in usual sterile fashion with betadine scrub and DuraPrep. Area of incision was infiltrated with local lidocaine. Incision was made to the lumbodorsal fascia was incised and exposure was performed of the L 45 and L5-S1 spinous processes laminae facet joint and transverse processes. Intraoperative x-ray  was obtained which confirmed correct orientation. A Gill procedure of L5 was performed with disarticulation of the facet joints at this level and thorough decompression was performed of both L5 and S1 nerve roots along with the common dural tube. A total laminectomy of L4 was performed with thorough decompression of the bilateral L4, L5, and S1 nerve roots. This decompression was far greater than typical decompression that would be typical for PLIF alone with painstaking microdissection under Loupe magnification of neural elements. A thorough discectomy was initially performed on the left with preparation of the endplates for grafting a trial spacer was placed this level and a thorough discectomy was performed on the right as well at both levels. Bone autograft was packed within the interspace bilaterally along with small BMP kit and NexOss bone graft extender. Bilateral median 8 mm peek cages were packed with BMP and extender and were inserted at the L 5S1  interspace and countersunk appropriately. 10mm PEEK cages were placed at the L45 level bilaterally. The posterolateral region was extensively decorticated and vertical probes were placed at L 4, L5 and S1 levels bilaterally. Intraoperative fluoroscopy confirmed correct orientationin the AP and lateral plane. 55 x 6.5 mm pedicle screws were placed at S1 bilaterally and 50 x 6.5 mm screws placed at L5 bilaterally and similarly sized screws at the L 4 level bilaterally. Final x-rays demonstrated well-positioned interbody grafts and pedicle screw fixation. A 60 mm lordotic rod was placed on the right and a 60 mm rod was placed on the left locked down in situ and the posterolateral region was packed with the remaining bone graft extender on the and autograft on the right   and bone autograft on the left. Is irrigated 20 cc of long-acting Marcaine was used in the posterolateral soft tissue. A medium Hemovac drain was placed through a separate stab incision. Fascia was  closed with 1 Vicryl sutures skin edges were reapproximated 2 and 3-0 Vicryl sutures. The wound is dressed with benzoin Steri-Strips Telfa gauze and tape the patient was extubated in the operating room and taken to recovery in stable satisfactory condition he tolerated traction well counts were correct at the end of the case.  PLAN OF CARE: Admit to inpatient   PATIENT DISPOSITION:  PACU - hemodynamically stable.   Delay start of Pharmacological VTE agent (>24hrs) due to surgical blood loss or risk of bleeding: yes  

## 2012-12-12 NOTE — Brief Op Note (Signed)
12/12/2012  12:43 PM  PATIENT:  [name]  57 y.o. male  PRE-OPERATIVE DIAGNOSIS:  Spondylolisthesis, Congenital spondylolysis, Lumbar stenosis, Lumbar radiculopathy L45 and L5S1  POST-OPERATIVE DIAGNOSIS:  Spondylolisthesis, Congenital spondylolysis, Lumbar stenosis, Lumbar radiculopathy L45 and L5S1  PROCEDURE:  Procedure(s) with comments: POSTERIOR LUMBAR FUSION 2 LEVEL (N/A) - Lumbar four to -Sacral one Decompression/Fusion L5 Gill, L4/5 Decompression, L45 and L5 S1 PLIF with autograft and allograft, pedicle screw fixation L 4 - S 1 levels with posterolateral arthrodesis.  SURGEON:  Surgeon(s) and Role:    * Maeola Harman, MD - Primary    * Clydene Fake, MD - Assisting  PHYSICIAN ASSISTANT:   ASSISTANTS: Poteat, RN   ANESTHESIA:   general  EBL:  Total I/O In: 2450 [I.V.:2100; Blood:100; IV Piggyback:250] Out: 585 [Urine:285; Blood:300]  BLOOD ADMINISTERED:100 CC CELLSAVER  DRAINS: (Medium) Hemovact drain(s) in the epidural space with  Suction Open   LOCAL MEDICATIONS USED:  LIDOCAINE   SPECIMEN:  No Specimen  DISPOSITION OF SPECIMEN:  N/A  COUNTS:  YES  TOURNIQUET:  * No tourniquets in log *  DICTATION: DICTATION: Patient is 57 year old man with spondylolysis L 5  wilth mobile spondylolisthesis of L5 on S1 and retrololisthesis and severe stenosis L 45. He has severe bilateral L5 radiculopathy. It was elected to take him to surgery for decompression and fusion at L45 and L5S1 levels.   Procedure: Patient was placed in a prone position on the Macdoel table after smooth and uncomplicated induction of general endotracheal anesthesia. His low back was prepped and draped in usual sterile fashion with betadine scrub and DuraPrep. Area of incision was infiltrated with local lidocaine. Incision was made to the lumbodorsal fascia was incised and exposure was performed of the L 45 and L5-S1 spinous processes laminae facet joint and transverse processes. Intraoperative x-ray  was obtained which confirmed correct orientation. A Gill procedure of L5 was performed with disarticulation of the facet joints at this level and thorough decompression was performed of both L5 and S1 nerve roots along with the common dural tube. A total laminectomy of L4 was performed with thorough decompression of the bilateral L4, L5, and S1 nerve roots. This decompression was far greater than typical decompression that would be typical for PLIF alone with painstaking microdissection under Loupe magnification of neural elements. A thorough discectomy was initially performed on the left with preparation of the endplates for grafting a trial spacer was placed this level and a thorough discectomy was performed on the right as well at both levels. Bone autograft was packed within the interspace bilaterally along with small BMP kit and NexOss bone graft extender. Bilateral median 8 mm peek cages were packed with BMP and extender and were inserted at the L 5S1  interspace and countersunk appropriately. 10mm PEEK cages were placed at the L45 level bilaterally. The posterolateral region was extensively decorticated and vertical probes were placed at L 4, L5 and S1 levels bilaterally. Intraoperative fluoroscopy confirmed correct orientationin the AP and lateral plane. 55 x 6.5 mm pedicle screws were placed at S1 bilaterally and 50 x 6.5 mm screws placed at L5 bilaterally and similarly sized screws at the L 4 level bilaterally. Final x-rays demonstrated well-positioned interbody grafts and pedicle screw fixation. A 60 mm lordotic rod was placed on the right and a 60 mm rod was placed on the left locked down in situ and the posterolateral region was packed with the remaining bone graft extender on the and autograft on the right  and bone autograft on the left. Is irrigated 20 cc of long-acting Marcaine was used in the posterolateral soft tissue. A medium Hemovac drain was placed through a separate stab incision. Fascia was  closed with 1 Vicryl sutures skin edges were reapproximated 2 and 3-0 Vicryl sutures. The wound is dressed with benzoin Steri-Strips Telfa gauze and tape the patient was extubated in the operating room and taken to recovery in stable satisfactory condition he tolerated traction well counts were correct at the end of the case.  PLAN OF CARE: Admit to inpatient   PATIENT DISPOSITION:  PACU - hemodynamically stable.   Delay start of Pharmacological VTE agent (>24hrs) due to surgical blood loss or risk of bleeding: yes

## 2012-12-12 NOTE — Clinical Social Work Note (Signed)
Clinical Social Work   CSW received consult for SNF. CSW reviewed chart, and pt had surgery today. Awaiting PT/OT evals for dishcarge recommendations, which are needed for SNF prior authorization. CSW will assess for SNF, if appropriate. CSW will continue to follow.   Dede Query, MSW, Theresia Majors [phone]

## 2012-12-12 NOTE — Preoperative (Signed)
Beta Blockers   Reason not to administer Beta Blockers:Not Applicable 

## 2012-12-13 MED ORDER — OXYCODONE HCL 5 MG PO TABS
5.0000 mg | ORAL_TABLET | ORAL | Status: DC | PRN
  Administered 2012-12-13 – 2012-12-14 (×2): 5 mg via ORAL
  Filled 2012-12-13 (×2): qty 1

## 2012-12-13 MED ORDER — HYDROCODONE-ACETAMINOPHEN 10-325 MG PO TABS: 1.0000 | ORAL_TABLET | ORAL | Status: DC | PRN

## 2012-12-13 MED FILL — Heparin Sodium (Porcine) Inj 1000 Unit/ML: INTRAMUSCULAR | Qty: 30 | Status: AC

## 2012-12-13 MED FILL — Sodium Chloride IV Soln 0.9%: INTRAVENOUS | Qty: 1000 | Status: AC

## 2012-12-13 MED FILL — Sodium Chloride Irrigation Soln 0.9%: Qty: 3000 | Status: AC

## 2012-12-13 NOTE — Evaluation (Signed)
Occupational Therapy Evaluation and Discharge Summary Patient Details Name: [name] MRN: [id number] DOB: Jun 17, 1956 Today's Date: 12/13/2012 Time: 1478-2956 OT Time Calculation (min): 22 min  OT Assessment / Plan / Recommendation Clinical Impression  Pt is a 57 yo male admittede for a L4-S1 PLIF who is doing very well with adls. All education re: adls complete and pt is overall S to occasional min assist with all adls and has 24/7 S at home.  Will d/c OT.    OT Assessment  Patient does not need any further OT services    Follow Up Recommendations  No OT follow up    Barriers to Discharge      Equipment Recommendations  3 in 1 bedside comode    Recommendations for Other Services    Frequency       Precautions / Restrictions Precautions Precautions: Back;Fall Precaution Booklet Issued: No Precaution Comments: No order for back brace, but pt notes he had it prior to surgery and MD told him to wear it again after surgery.   Required Braces or Orthoses: Spinal Brace Spinal Brace: Lumbar corset;Applied in sitting position Restrictions Weight Bearing Restrictions: No   Pertinent Vitals/Pain Pt with 2/10 pain in back.    ADL  Eating/Feeding: Performed;Independent Where Assessed - Eating/Feeding: Chair Grooming: Performed;Wash/dry hands;Supervision/safety Where Assessed - Grooming: Supported standing Upper Body Bathing: Simulated;Set up Where Assessed - Upper Body Bathing: Unsupported sitting Lower Body Bathing: Simulated;Minimal assistance Where Assessed - Lower Body Bathing: Supported sit to stand Upper Body Dressing: Performed;Set up Where Assessed - Upper Body Dressing: Supported sitting Lower Body Dressing: Performed;Minimal assistance Where Assessed - Lower Body Dressing: Supported sit to stand Toilet Transfer: Research scientist (life sciences) Method: Other (comment) (ambulated to BR) Acupuncturist: Raised toilet seat with arms (or 3-in-1  over toilet) Toileting - Clothing Manipulation and Hygiene: Performed;Supervision/safety Where Assessed - Glass blower/designer Manipulation and Hygiene: Standing Equipment Used: Rolling walker;Back brace Transfers/Ambulation Related to ADLs: S with all ambulating in room ADL Comments: Pt needed assist with L sock and washing L foot.  pt with L leg pain prior to surgery.      OT Diagnosis:    OT Problem List:   OT Treatment Interventions:     OT Goals    Visit Information  Last OT Received On: 12/13/12 Assistance Needed: +1    Subjective Data  Subjective: " I feel good.  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 [place] Communication Communication: No difficulties Dominant Hand: Right         Vision/Perception Vision - History Baseline Vision: No visual deficits Patient Visual Report: No change from baseline Vision - Assessment Eye Alignment: Within Functional Limits Vision Assessment: Vision not tested   [employer] Overall Cognitive Status: Appears within functional limits for tasks assessed/performed Arousal/Alertness: Awake/alert Orientation Level: Oriented X4 / Intact Behavior During Session: [facility] for tasks performed    Extremity/Trunk Assessment Right Upper Extremity Assessment RUE ROM/Strength/Tone: Within functional levels RUE Sensation: WFL - Light Touch RUE  Coordination:  WFL - gross/fine motor Left Upper Extremity Assessment LUE ROM/Strength/Tone: Within functional levels LUE Sensation: WFL - Light Touch LUE Coordination: WFL - gross/fine motor Right Lower Extremity Assessment RLE ROM/Strength/Tone: WFL for tasks assessed RLE Sensation: Deficits RLE Sensation Deficits: pt indicates diminished sensation in great toe.   Left Lower Extremity Assessment LLE ROM/Strength/Tone: WFL for tasks assessed LLE Sensation: WFL - Light Touch Trunk Assessment Trunk Assessment: Normal     Mobility Bed Mobility Bed Mobility: Rolling Left;Left Sidelying to Sit;Sitting - Scoot to Edge of Bed Rolling Left: 5: Supervision Left Sidelying to Sit: 5: Supervision Sitting - Scoot to Edge of Bed: 5: Supervision Details for Bed Mobility Assistance: pt demos good technique.   Transfers Transfers: Stand to Sit;Sit to Stand Sit to Stand: 5: Supervision;From chair/3-in-1 Stand to Sit: 5: Supervision;To chair/3-in-1 Details for Transfer Assistance: Cues for hand placement     Exercise     Balance Balance Balance Assessed: No   End of Session OT - End of Session Activity Tolerance: Patient tolerated treatment well Patient left: Other (comment) (walking hall with wife.) Nurse Communication: Mobility status  GO     Hope Budds 12/13/2012, 12:58 PM [phone]

## 2012-12-13 NOTE — Progress Notes (Signed)
Subjective: Patient reports doing well  Objective: Vital signs in last 24 hours: Temp:  [96.8 F (36 C)-98.6 F (37 C)] 98.6 F (37 C) (03/12 0600) Pulse Rate:  [53-109] 75 (03/12 0600) Resp:  [9-19] 16 (03/12 0600) BP: (101-127)/(46-63) 105/50 mmHg (03/12 0600) SpO2:  [97 %-100 %] 98 % (03/12 0600) Weight:  [85.458 kg (188 lb 6.4 oz)] 85.458 kg (188 lb 6.4 oz) (03/11 1445)  Intake/Output from previous day: 03/11 0701 - 03/12 0700 In: 2690 [P.O.:240; I.V.:2100; Blood:100; IV Piggyback:250] Out: 2715 [Urine:1985; Drains:430; Blood:300] Intake/Output this shift:    Physical Exam: Full strength, dressing minimal drainage  Lab Results: No results found for this basename: WBC, HGB, HCT, PLT,  in the last 72 hours BMET No results found for this basename: NA, K, CL, CO2, GLUCOSE, BUN, CREATININE, CALCIUM,  in the last 72 hours  Studies/Results: Dg Lumbar Spine 2-3 Views  12/12/2012  *RADIOLOGY REPORT*  Clinical Data: Posterior lumbar interbody fusion L4-L5, L5-S1  LUMBAR SPINE - 2-3 VIEW  Comparison: Lateral intraoperative radiograph same date, [facility] MRI 10/23/2012  Findings: Numbering is as per the previous exam on the same date and prior exam 10/23/2012.  Two images demonstrate evidence of posterior fusion at L4-S1.  IMPRESSION: Expected intraoperative appearance after L4-S1 posterior interbody fusion.   Original Report Authenticated By: Christiana Pellant, M.D.    Dg Lumbar Spine 2-3 Views  12/12/2012  *RADIOLOGY REPORT*  Clinical Data: Posterior fusion.  LUMBAR SPINE - 2-3 VIEW  Comparison: MRI 10/23/2012  Findings: Posterior surgical instruments are in place extending from L3-4 through L5 S1.  IMPRESSION: Intraoperative localization as above.   Original Report Authenticated By: Charlett Nose, M.D.     Assessment/Plan: Mobilize with PT. D/C PCA.  Continue drain today.  Oxycodone and hydrocodone for pain.    LOS: 1 day    Dorian Heckle, MD 12/13/2012, 7:16 AM

## 2012-12-13 NOTE — Care Management Note (Signed)
    Page 1 of 2   12/15/2012     8:34:03 AM   CARE MANAGEMENT NOTE 12/15/2012  Patient:  [name]   Account Number:  [id number]  Date Initiated:  12/12/2012  Documentation initiated by:  [facility]  Subjective/Objective Assessment:   admitted postop PLIF L4-5, L5-S1     Action/Plan:   PT/OT evals-recommending HHPT, rolling walker and 3N1   Anticipated DC Date:  12/15/2012   Anticipated DC Plan:  HOME W HOME HEALTH SERVICES      DC Planning Services  CM consult      Choice offered to / List presented to:  C-1 Patient   DME arranged  3-N-1  Levan Hurst      DME agency  [facility]     HH arranged  HH-2 PT      HH agency  [facility]   Status of service:  Completed, signed off Medicare Important Message given?   (If response is "NO", the following Medicare IM given date fields will be blank) Date Medicare IM given:   Date Additional Medicare IM given:    Discharge Disposition:  HOME W HOME HEALTH SERVICES  Per UR Regulation:  Reviewed for med. necessity/level of care/duration of stay  If discussed at Long Length of Stay Meetings, dates discussed:    Comments:  12/16/11 [facility] and informed them of patient's d/c on 12/14/12. Jacquelynn Cree RN, BSN, CCM   12/13/12 Spoke with patient and his wife about HHC for HHPT. They chose [facility].Patient will need a rolling walker and 3N1. Contacted [facility], spoke with Jola Babinski they will service the patient, need  face sheet, order, H and P, and PT eval faxed to [phone]. Faxed and verified receipt with Darel Hong. Jacquelynn Cree RN, BSN, CCM

## 2012-12-13 NOTE — Evaluation (Signed)
Physical Therapy Evaluation Patient Details Name: [name] MRN: [id number] DOB: January 17, 1956 Today's Date: 12/13/2012 Time: 4098-1191 PT Time Calculation (min): 33 min  PT Assessment / Plan / Recommendation Clinical Impression  pt presents with L4-S1 PLIF.  pt very motivated and anticipate great progress.  pt's wife has a RW for pt to use at home and she is to check to see if it adjusts tall enough for pt.  If RW is not tall enough, pt will need RW for D/C.      PT Assessment  Patient needs continued PT services    Follow Up Recommendations  Home health PT;Supervision - Intermittent    Does the patient have the potential to tolerate intense rehabilitation      Barriers to Discharge None      Equipment Recommendations  Rolling walker with 5" wheels (if wife's is not tall enough for him.  )    Recommendations for Other Services OT consult   Frequency Min 5X/week    Precautions / Restrictions Precautions Precautions: Back;Fall Precaution Booklet Issued: No Precaution Comments: No order for back brace, but pt notes he had it prior to surgery and MD told him to wear it again after surgery.   Required Braces or Orthoses: Spinal Brace Spinal Brace: Lumbar corset;Applied in sitting position Restrictions Weight Bearing Restrictions: No   Pertinent Vitals/Pain Pt indicates pain is "tolerable".        Mobility  Bed Mobility Bed Mobility: Rolling Left;Left Sidelying to Sit;Sitting - Scoot to Edge of Bed Rolling Left: 5: Supervision Left Sidelying to Sit: 5: Supervision Sitting - Scoot to Edge of Bed: 5: Supervision Details for Bed Mobility Assistance: pt demos good technique.   Transfers Transfers: Sit to Stand;Stand to Sit Sit to Stand: 4: Min guard;With upper extremity assist;From bed Stand to Sit: 4: Min guard;With upper extremity assist;To chair/3-in-1;With armrests Details for Transfer Assistance: cues for UE use.   Ambulation/Gait Ambulation/Gait Assistance: 4: Min  guard Ambulation Distance (Feet): 180 Feet Assistive device: Rolling walker Ambulation/Gait Assistance Details: cues for positioning in RW, safety with turns, back precautions with turns.   Gait Pattern: Step-through pattern;Decreased stride length Stairs: No Wheelchair Mobility Wheelchair Mobility: No    Exercises     PT Diagnosis: Difficulty walking;Acute pain  PT Problem List: Decreased activity tolerance;Decreased balance;Decreased mobility;Decreased knowledge of use of DME;Decreased knowledge of precautions;Pain PT Treatment Interventions: DME instruction;Gait training;Stair training;Functional mobility training;Therapeutic activities;Therapeutic exercise;Balance training;Patient/family education   PT Goals Acute Rehab PT Goals PT Goal Formulation: With patient Time For Goal Achievement: 12/20/12 Potential to Achieve Goals: Good Pt will Roll Supine to Left Side: with modified independence PT Goal: Rolling Supine to Left Side - Progress: Goal set today Pt will go Supine/Side to Sit: with modified independence PT Goal: Supine/Side to Sit - Progress: Goal set today Pt will go Sit to Supine/Side: with modified independence PT Goal: Sit to Supine/Side - Progress: Goal set today Pt will go Sit to Stand: with modified independence PT Goal: Sit to Stand - Progress: Goal set today Pt will go Stand to Sit: with modified independence PT Goal: Stand to Sit - Progress: Goal set today Pt will Ambulate: >150 feet;with modified independence;with least restrictive assistive device PT Goal: Ambulate - Progress: Goal set today Pt will Go Up / Down Stairs: Flight;with supervision;with rail(s) PT Goal: Up/Down Stairs - Progress: Goal set today Additional Goals Additional Goal #1: pt will verbalize and follow back precautions.   PT Goal: Additional Goal #1 - Progress: Goal set  today  Visit Information  Last PT Received On: 12/13/12 Assistance Needed: +1    Subjective Data  Subjective: I'd  love to get out of this bed.   Patient Stated Goal: Home   Prior Functioning  Home Living Lives With: Spouse;Daughter Available Help at Discharge: Family;Available 24 hours/day Type of Home: House Home Access: Stairs to enter [employer] of Steps: 2 Entrance Stairs-Rails: Right Home Layout: Bed/bath upstairs;1/2 bath on main level Home Adaptive Equipment: Walker - rolling (Wife to check if RW will be tall enough for pt.  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  ) Communication Communication: No difficulties    Cognition  Cognition Overall Cognitive Status: Appears within functional limits for tasks assessed/performed Arousal/Alertness: Awake/alert Orientation Level: Appears intact for tasks assessed Behavior During Session: [facility] for tasks performed    Extremity/Trunk Assessment Right Lower Extremity Assessment RLE ROM/Strength/Tone: [facility] for tasks assessed RLE Sensation: Deficits RLE Sensation Deficits: pt indicates diminished sensation in great toe.   Left Lower Extremity Assessment LLE ROM/Strength/Tone: WFL for tasks assessed LLE Sensation: WFL - Light Touch Trunk Assessment Trunk Assessment: Normal   Balance Balance Balance Assessed: No  End of Session PT - End of Session Equipment Utilized During Treatment: Gait belt;Back brace Activity Tolerance: Patient tolerated treatment well Patient left: in chair;with call bell/phone within reach;with family/visitor present Nurse Communication: Mobility status  GP     Sunny Schlein, [place] [phone] 12/13/2012, 12:07 PM

## 2012-12-13 NOTE — Clinical Social Work Note (Signed)
Clinical Social Work   CSW reviewed chart. PT is recommending HHPT. CSW will update RNCM. CSW is signing off as no further needs identified. Please reconsult if a need arises prior to discharge.   Dede Query, MSW, Theresia Majors [phone]

## 2012-12-13 NOTE — Progress Notes (Signed)
Patient able to log roll to side of bed and apply brace. Stood to the side of bed. C/O very slight lightheadedness, so ambulation was not attempted. Foley catheter removed at 0630. Fluids encouraged and patient educated on expectations to void within 6-8 hours. Will continue to monitor.

## 2012-12-14 NOTE — Discharge Summary (Signed)
Physician Discharge Summary  Patient ID: [name] MRN: [id number] DOB/AGE: 1956-01-02 57 y.o.  Admit date: 12/12/2012 Discharge date: 12/14/2012  Admission Diagnoses: Spondylolisthesis, Congenital spondylolysis, Lumbar stenosis, Lumbar radiculopathy L45 and L5S1    Discharge Diagnoses: Spondylolisthesis, Congenital spondylolysis, Lumbar stenosis, Lumbar radiculopathy L45 and L5S1 s/p POSTERIOR LUMBAR FUSION 2 LEVEL (N/A) - Lumbar four to -Sacral one Decompression/Fusion L5 Gill, L4/5 Decompression, L45 and L5 S1 PLIF with autograft and allograft, pedicle screw fixation L 4 - S 1 levels with posterolateral arthrodesis.   Active Problems:   * No active hospital problems. *   Discharged Condition: good  Hospital Course: [name] was admitted for surgery with dx lumbar radiculopathy L4-5, L5-S1, with spondylolisthesis, stenosis, and spondylolysis.  Following uncomplicated PLIF L4-5, L5-S1, he recovered nicely in Neuro PACU and transferred to 4N for nursing and therapies. He has progressed well.   Consults: None  Significant Diagnostic Studies: radiology: X-Ray: intra-operative  Treatments: surgery: POSTERIOR LUMBAR FUSION 2 LEVEL (N/A) - Lumbar four to -Sacral one Decompression/Fusion L5 Gill, L4/5 Decompression, L45 and L5 S1 PLIF with autograft and allograft, pedicle screw fixation L 4 - S 1 levels with posterolateral arthrodesis.    Discharge Exam: Blood pressure 115/58, pulse 80, temperature 98.6 F (37 C), temperature source Oral, resp. rate 18, height 6\' 2"  (1.88 m), weight 85.458 kg (188 lb 6.4 oz), SpO2 98.00%. Alert, conversant. Incision with steri's. No erythema, swelling, or drainage. Good strength BLE.   Disposition: Discharge to home. HHPT for safety evaluation/recommendations. Rx's to pt: Percocet 10/325 1-2 po q4hrs prn pain & Valium 5mg  1po q6hrs prn spasm.     Medication List    TAKE these medications       aspirin 81 MG tablet  Take 81 mg by mouth  daily.     atorvastatin 10 MG tablet  Commonly known as:  LIPITOR  Take 10 mg by mouth every other day.     ranitidine 75 MG tablet  Commonly known as:  ZANTAC  Take 75 mg by mouth daily.     traMADol 50 MG tablet  Commonly known as:  ULTRAM  Take 12.5 mg by mouth daily as needed for pain.     VITAMIN B COMPLEX PO  Take 1 tablet by mouth daily.         Signed: [name] 12/14/2012, 2:05 PM

## 2012-12-14 NOTE — Consult Note (Cosign Needed)
Sonja Wilson EdD 

## 2012-12-14 NOTE — Progress Notes (Signed)
Subjective: Patient reports "I think I'm doing well"  Objective: Vital signs in last 24 hours: Temp:  [99 F (37.2 C)-101.5 F (38.6 C)] 99.6 F (37.6 C) (03/13 0749) Pulse Rate:  [66-88] 80 (03/13 0749) Resp:  [16-18] 16 (03/13 0532) BP: (100-120)/(48-60) 113/59 mmHg (03/13 0749) SpO2:  [95 %-99 %] 97 % (03/13 0749)  Intake/Output from previous day: 03/12 0701 - 03/13 0700 In: 800 [P.O.:760] Out: 125 [Drains:125] Intake/Output this shift:    Alert, sitting in chair after walking with PT. Hemovac ~153ml last 24hrs - pulled. Pt reports intermittent lumbar & bilat leg pain, controlled with po meds. Afebrile this am, using incentive spirometer. Incision with Steri's No erythema, swelling, or drainage.   Lab Results: No results found for this basename: WBC, HGB, HCT, PLT,  in the last 72 hours BMET No results found for this basename: NA, K, CL, CO2, GLUCOSE, BUN, CREATININE, CALCIUM,  in the last 72 hours  Studies/Results: Dg Lumbar Spine 2-3 Views  12/12/2012  *RADIOLOGY REPORT*  Clinical Data: Posterior lumbar interbody fusion L4-L5, L5-S1  LUMBAR SPINE - 2-3 VIEW  Comparison: Lateral intraoperative radiograph same date, [facility] 10/23/2012  Findings: Numbering is as per the previous exam on the same date and prior exam 10/23/2012.  Two images demonstrate evidence of posterior fusion at L4-S1.  IMPRESSION: Expected intraoperative appearance after L4-S1 posterior interbody fusion.   Original Report Authenticated By: Christiana Pellant, M.D.    Dg Lumbar Spine 2-3 Views  12/12/2012  *RADIOLOGY REPORT*  Clinical Data: Posterior fusion.  LUMBAR SPINE - 2-3 VIEW  Comparison: MRI 10/23/2012  Findings: Posterior surgical instruments are in place extending from L3-4 through L5 S1.  IMPRESSION: Intraoperative localization as above.   Original Report Authenticated By: Charlett Nose, M.D.     Assessment/Plan: Improving   LOS: 2 days  Hemovac pulled; DSD to drain site.  Will monitor  temp & work on bowels today. Ok to shower.    [name] 12/14/2012, 8:38 AM

## 2012-12-14 NOTE — Progress Notes (Signed)
Improving nicely.  May go home this afternoon.

## 2012-12-14 NOTE — Progress Notes (Signed)
Physical Therapy Treatment Patient Details Name: [name] MRN: [id number] DOB: [birth year] Today's Date: 12/14/2012 Time: 4098-1191 PT Time Calculation (min): 26 min  PT Assessment / Plan / Recommendation Comments on Treatment Session  pt presents with L4-5 PLIF.  pt making great progress and moving well.  Ready for D/C to home from PT stand point.      Follow Up Recommendations  Home health PT;Supervision - Intermittent     Does the patient have the potential to tolerate intense rehabilitation     Barriers to Discharge        Equipment Recommendations  Rolling walker with 5" wheels (3-in-1)    Recommendations for Other Services    Frequency Min 5X/week   Plan Discharge plan remains appropriate;Frequency remains appropriate    Precautions / Restrictions Precautions Precautions: Back;Fall Precaution Comments: No order for back brace, but pt notes he had it prior to surgery and MD told him to wear it again after surgery.   Required Braces or Orthoses: Spinal Brace Spinal Brace: Lumbar corset;Applied in sitting position Restrictions Weight Bearing Restrictions: No   Pertinent Vitals/Pain Pt notes pain is tolerable.      Mobility  Bed Mobility Bed Mobility: Not assessed Transfers Transfers: Sit to Stand;Stand to Sit Sit to Stand: 6: Modified independent (Device/Increase time);With upper extremity assist;From chair/3-in-1;With armrests Stand to Sit: 6: Modified independent (Device/Increase time);With upper extremity assist;To chair/3-in-1;With armrests Details for Transfer Assistance: Demos good technique.   Ambulation/Gait Ambulation/Gait Assistance: 5: Supervision Ambulation Distance (Feet): 200 Feet Assistive device: Rolling walker Ambulation/Gait Assistance Details: pt with very rigid posture.  Cues to relax shoulders.   Gait Pattern: Step-through pattern;Decreased stride length Stairs: Yes Stairs Assistance: 5: Supervision Stairs Assistance Details (indicate cue  type and reason): cues for sequencing on steps. Stair Management Technique: One rail Left;Forwards Number of Stairs: 13 ( and 2) Wheelchair Mobility Wheelchair Mobility: No    Exercises     PT Diagnosis:    PT Problem List:   PT Treatment Interventions:     PT Goals Acute Rehab PT Goals Time For Goal Achievement: 12/20/12 Potential to Achieve Goals: Good PT Goal: Sit to Stand - Progress: Met PT Goal: Stand to Sit - Progress: Met PT Goal: Ambulate - Progress: Progressing toward goal PT Goal: Up/Down Stairs - Progress: Met Additional Goals PT Goal: Additional Goal #1 - Progress: Progressing toward goal  Visit Information  Last PT Received On: 12/14/12 Assistance Needed: +1    Subjective Data  Subjective: I'm doing pretty good this morning.     Cognition  Cognition Overall Cognitive Status: Appears within functional limits for tasks assessed/performed Arousal/Alertness: Awake/alert Orientation Level: Oriented X4 / Intact Behavior During Session: [facility] for tasks performed    Balance  Balance Balance Assessed: No  End of Session PT - End of Session Equipment Utilized During Treatment: Gait belt;Back brace Activity Tolerance: Patient tolerated treatment well Patient left: in chair;with call bell/phone within reach;with family/visitor present Nurse Communication: Mobility status   GP     Sunny Schlein, Mar-Mac [phone] 12/14/2012, 8:37 AM

## 2012-12-14 NOTE — Progress Notes (Signed)
Patient with low grade fever all night ranging from 99.0 to 101.5. Controlled with tylenol and ice packs to underarms.

## 2012-12-14 NOTE — Progress Notes (Signed)
Pt doing well, ambulating in hallway with brace and walker.

## 2012-12-26 NOTE — Discharge Summary (Signed)
Doing well.  Home today 

## 2014-04-03 ENCOUNTER — Encounter (HOSPITAL_COMMUNITY): Payer: Self-pay | Admitting: Pharmacy Technician

## 2014-04-08 ENCOUNTER — Encounter (HOSPITAL_COMMUNITY): Admission: RE | Admit: 2014-04-08 | Payer: BC Managed Care – PPO | Source: Ambulatory Visit

## 2014-04-10 ENCOUNTER — Ambulatory Visit (HOSPITAL_COMMUNITY)
Admission: RE | Admit: 2014-04-10 | Discharge: 2014-04-10 | Disposition: A | Discharge status: Home / Self Care | Payer: BC Managed Care – PPO | Source: Ambulatory Visit | Attending: Urology | Admitting: Urology

## 2014-04-10 ENCOUNTER — Encounter (HOSPITAL_COMMUNITY)
Admission: RE | Disposition: A | Discharge status: Home / Self Care | Payer: Self-pay | Source: Ambulatory Visit | Attending: Urology

## 2014-04-10 ENCOUNTER — Ambulatory Visit (HOSPITAL_COMMUNITY): Payer: BC Managed Care – PPO

## 2014-04-10 ENCOUNTER — Encounter (HOSPITAL_COMMUNITY): Payer: Self-pay

## 2014-04-10 DIAGNOSIS — Z7982 Long term (current) use of aspirin: Secondary | ICD-10-CM | POA: Insufficient documentation

## 2014-04-10 DIAGNOSIS — E785 Hyperlipidemia, unspecified: Secondary | ICD-10-CM | POA: Insufficient documentation

## 2014-04-10 DIAGNOSIS — Z79899 Other long term (current) drug therapy: Secondary | ICD-10-CM | POA: Insufficient documentation

## 2014-04-10 DIAGNOSIS — N201 Calculus of ureter: Secondary | ICD-10-CM | POA: Insufficient documentation

## 2014-04-10 HISTORY — PX: EXTRACORPOREAL SHOCK WAVE LITHOTRIPSY: SHX1557

## 2014-04-10 SURGERY — LITHOTRIPSY, ESWL
Anesthesia: Moderate Sedation | Laterality: Right

## 2014-04-10 MED ORDER — CIPROFLOXACIN HCL 250 MG PO TABS
500.0000 mg | ORAL_TABLET | Freq: Once | ORAL | Status: AC
  Administered 2014-04-10: 500 mg via ORAL
  Filled 2014-04-10: qty 2

## 2014-04-10 MED ORDER — DIPHENHYDRAMINE HCL 25 MG PO TABS
25.0000 mg | ORAL_TABLET | Freq: Once | ORAL | Status: AC
  Administered 2014-04-10: 25 mg via ORAL
  Filled 2014-04-10 (×2): qty 1

## 2014-04-10 MED ORDER — LACTATED RINGERS IV SOLN
INTRAVENOUS | Status: DC
  Administered 2014-04-10: 08:00:00 via INTRAVENOUS

## 2014-04-10 MED ORDER — DIAZEPAM 5 MG PO TABS
10.0000 mg | ORAL_TABLET | Freq: Once | ORAL | Status: AC
Start: 2014-04-10 — End: 2014-04-10
  Administered 2014-04-10: 10 mg via ORAL
  Filled 2014-04-10: qty 2

## 2014-04-10 NOTE — Discharge Instructions (Signed)

## 2014-04-10 NOTE — H&P (Signed)
  Stone has progressed distally, otherwise no change in medical status

## 2014-04-12 ENCOUNTER — Encounter (HOSPITAL_COMMUNITY): Payer: Self-pay | Admitting: Urology

## 2014-12-11 ENCOUNTER — Telehealth: Payer: Self-pay | Admitting: Internal Medicine

## 2015-01-10 ENCOUNTER — Encounter: Payer: Self-pay | Admitting: Internal Medicine

## 2015-03-19 ENCOUNTER — Ambulatory Visit (AMBULATORY_SURGERY_CENTER): Payer: Self-pay | Admitting: *Deleted

## 2015-03-19 VITALS — Ht 74.0 in | Wt 169.0 lb

## 2015-03-19 DIAGNOSIS — Z800 Family history of malignant neoplasm of digestive organs: Secondary | ICD-10-CM

## 2015-03-19 NOTE — Progress Notes (Signed)
Patient denies any allergies to eggs or soy. Patient denies any problems with anesthesia/sedation. Patient denies any oxygen use at home and does not take any diet/weight loss medications. EMMI education assisgned to patient on colonoscopy, this was explained and instructions given to patient. 

## 2015-04-02 ENCOUNTER — Ambulatory Visit (AMBULATORY_SURGERY_CENTER): Payer: BLUE CROSS/BLUE SHIELD | Admitting: Internal Medicine

## 2015-04-02 ENCOUNTER — Encounter: Payer: Self-pay | Admitting: Internal Medicine

## 2015-04-02 VITALS — BP 110/59 | HR 52 | Temp 97.7°F | Resp 16 | Ht 74.0 in | Wt 169.0 lb

## 2015-04-02 DIAGNOSIS — K5730 Diverticulosis of large intestine without perforation or abscess without bleeding: Secondary | ICD-10-CM

## 2015-04-02 DIAGNOSIS — Z1211 Encounter for screening for malignant neoplasm of colon: Secondary | ICD-10-CM | POA: Diagnosis not present

## 2015-04-02 DIAGNOSIS — Z800 Family history of malignant neoplasm of digestive organs: Secondary | ICD-10-CM | POA: Diagnosis not present

## 2015-04-02 MED ORDER — SODIUM CHLORIDE 0.9 % IV SOLN: 500.0000 mL | INTRAVENOUS | Status: DC

## 2015-04-02 NOTE — Progress Notes (Signed)
Stable to RR 

## 2015-04-02 NOTE — Op Note (Signed)
[facility]  [address] [place], [id number]   COLONOSCOPY PROCEDURE REPORT  PATIENT: Jesus, Murray  MR#: [id number] BIRTHDATE: 02-Mar-1956 , 59  yrs. old GENDER: male ENDOSCOPIST: Gatha Mayer, MD, [facility] PROCEDURE DATE:  04/02/2015 PROCEDURE: First Screening Colonoscopy - Avg.  risk and is 50 yrs.  old or older - No.  Prior Negative Screening - Now for repeat screening. Less than 10 yrs Prior Negative Screening - Now for repeat screening.  Above average risk  History of Adenoma - Now for follow-up colonoscopy & has been > or = to 3 yrs.  N/A  Polyps removed today? No Recommend repeat exam, <10 yrs? Yes high risk ASA CLASS:   Class II INDICATIONS:Screening for colonic neoplasia and FH Colon or Rectal Adenocarcinoma. MEDICATIONS: Propofol 300 mg IV, Monitored anesthesia care, and Lidocaine 40 mg IV  DESCRIPTION OF PROCEDURE:   After the risks benefits and alternatives of the procedure were thoroughly explained, informed consent was obtained.  The digital rectal exam revealed no abnormalities of the rectum, revealed the prostate was not enlarged, and revealed no prostatic nodules.   The [id number]  endoscope was introduced through the anus and advanced to the cecum, which was identified by both the appendix and ileocecal valve. No adverse events experienced.   The quality of the prep was good.  (MiraLax was used)  The instrument was then slowly withdrawn as the colon was fully examined. Estimated blood loss is zero unless otherwise noted in this procedure report.      COLON FINDINGS: There was mild diverticulosis noted in the sigmoid colon.   The examination was otherwise normal.  Retroflexed views revealed no abnormalities. The time to cecum = 3.4 Withdrawal time = 9.2   The scope was withdrawn and the procedure completed. COMPLICATIONS: There were no immediate complications.  ENDOSCOPIC IMPRESSION: 1.   Mild diverticulosis was noted in the  sigmoid colon 2.   The examination was otherwise normal good prep  RECOMMENDATIONS: Repeat Colonoscopy in 5 years.  2021 - FHx CRCA father before age 28  eSigned:  Gatha Mayer, MD, [facility] 04/02/2015 12:29 PM   cc: The Patient and Dr. Moshe Cipro [facility])

## 2015-04-02 NOTE — Patient Instructions (Addendum)
   No polyps or cancer. You do have a condition called diverticulosis - common and not usually a problem. Please read the handout provided.  Your next routine colonoscopy should be in 5 years - 2021.  I appreciate the opportunity to care for you. Gatha Mayer, MD, FACG   YOU HAD AN ENDOSCOPIC PROCEDURE TODAY AT [facility]:   Refer to the procedure report that was given to you for any specific questions about what was found during the examination.  If the procedure report does not answer your questions, please call your gastroenterologist to clarify.  If you requested that your care partner not be given the details of your procedure findings, then the procedure report has been included in a sealed envelope for you to review at your convenience later.  YOU SHOULD EXPECT: Some feelings of bloating in the abdomen. Passage of more gas than usual.  Walking can help get rid of the air that was put into your GI tract during the procedure and reduce the bloating. If you had a lower endoscopy (such as a colonoscopy or flexible sigmoidoscopy) you may notice spotting of blood in your stool or on the toilet paper. If you underwent a bowel prep for your procedure, you may not have a normal bowel movement for a few days.  Please Note:  You might notice some irritation and congestion in your nose or some drainage.  This is from the oxygen used during your procedure.  There is no need for concern and it should clear up in a day or so.  SYMPTOMS TO REPORT IMMEDIATELY:   Following lower endoscopy (colonoscopy or flexible sigmoidoscopy):  Excessive amounts of blood in the stool  Significant tenderness or worsening of abdominal pains  Swelling of the abdomen that is new, acute  Fever of 100F or higher  For urgent or emergent issues, a gastroenterologist can be reached at any hour by calling [phone].   DIET: Your first meal following the procedure should be a small meal and  then it is ok to progress to your normal diet. Heavy or fried foods are harder to digest and may make you feel nauseous or bloated.  Likewise, meals heavy in dairy and vegetables can increase bloating.  Drink plenty of fluids but you should avoid alcoholic beverages for 24 hours.  ACTIVITY:  You should plan to take it easy for the rest of today and you should NOT DRIVE or use heavy machinery until tomorrow (because of the sedation medicines used during the test).    FOLLOW UP: Our staff will call the number listed on your records the next business day following your procedure to check on you and address any questions or concerns that you may have regarding the information given to you following your procedure. If we do not reach you, we will leave a message.  However, if you are feeling well and you are not experiencing any problems, there is no need to return our call.  We will assume that you have returned to your regular daily activities without incident.  SIGNATURES/CONFIDENTIALITY: You and/or your care partner have signed paperwork which will be entered into your electronic medical record.  These signatures attest to the fact that that the information above on your After Visit Summary has been reviewed and is understood.  Full responsibility of the confidentiality of this discharge information lies with you and/or your care-partner.  Please read over handout about diverticulosis  Continue your normal medications

## 2015-04-03 ENCOUNTER — Telehealth: Payer: Self-pay | Admitting: Emergency Medicine

## 2015-04-03 NOTE — Telephone Encounter (Signed)
  Follow up Call-  Call back number 04/02/2015  Post procedure Call Back phone  # [phone]  Permission to leave phone message Yes     Patient questions:  Do you have a fever, pain , or abdominal swelling? No. Pain Score  0 *  Have you tolerated food without any problems? Yes.    Have you been able to return to your normal activities? Yes.    Do you have any questions about your discharge instructions: Diet   No. Medications  No. Follow up visit  No.  Do you have questions or concerns about your Care? No.  Actions: * If pain score is 4 or above: No action needed, pain <4.

## 2015-07-21 NOTE — Telephone Encounter (Signed)
Appt scheduled

## 2016-09-09 ENCOUNTER — Telehealth (INDEPENDENT_AMBULATORY_CARE_PROVIDER_SITE_OTHER): Payer: Self-pay | Admitting: Physical Medicine and Rehabilitation

## 2016-09-09 NOTE — Telephone Encounter (Signed)
Left a message for patient to call back to schedule.

## 2016-09-09 NOTE — Telephone Encounter (Signed)
If helped then ok 

## 2016-09-10 ENCOUNTER — Telehealth (INDEPENDENT_AMBULATORY_CARE_PROVIDER_SITE_OTHER): Payer: Self-pay

## 2016-09-13 NOTE — Telephone Encounter (Signed)
No prior auth required

## 2016-09-16 ENCOUNTER — Ambulatory Visit (INDEPENDENT_AMBULATORY_CARE_PROVIDER_SITE_OTHER): Payer: BLUE CROSS/BLUE SHIELD | Admitting: Physical Medicine and Rehabilitation

## 2016-09-16 ENCOUNTER — Encounter (INDEPENDENT_AMBULATORY_CARE_PROVIDER_SITE_OTHER): Payer: Self-pay | Admitting: Physical Medicine and Rehabilitation

## 2016-09-16 VITALS — BP 126/71 | HR 62

## 2016-09-16 DIAGNOSIS — M25552 Pain in left hip: Secondary | ICD-10-CM

## 2016-09-16 DIAGNOSIS — M1612 Unilateral primary osteoarthritis, left hip: Secondary | ICD-10-CM

## 2016-09-16 NOTE — Patient Instructions (Signed)

## 2016-09-16 NOTE — Progress Notes (Signed)
[name] - 60 y.o. male MRN [id number]  Date of birth: [birth year]  Office Visit Note: Visit Date: 09/16/2016 PCP: Moshe Cipro, MD Referred by: Moshe Cipro, MD  Subjective: Chief Complaint  Patient presents with  . Left Hip - Pain   HPI: Mr. [name] is a 60 year old gentleman that last year I completed a anesthetic hip arthrogram on the right and he did extremely well. Patient is here today for left hip pain. States last injection helped for several weeks. Not pain free but helped a lot . Increased pain last several week and worse with bending. There is been gradual increase in symptoms to the point where he file and that something done. He has had conservative care in the past without much relief up until he had the injection.    ROS Otherwise per HPI.  Assessment & Plan: Visit Diagnoses:  1. Pain in left hip   2. Unilateral primary osteoarthritis, left hip     Plan: Findings:  Right hip anesthetic arthrogram. This is for right hip pain somewhat different than prior. Pain is concordant with hip rotation.    Meds & Orders: No orders of the defined types were placed in this encounter.   Orders Placed This Encounter  Procedures  . Large Joint Injection/Arthrocentesis    Follow-up: Return for follow up with Dr. Erlinda Hong when pain returns.   Procedures: Hip anesthetic arthrogram Date/Time: 09/16/2016 3:43 PM Performed by: Magnus Sinning Authorized by: Magnus Sinning   Consent Given by:  Patient Site marked: the procedure site was marked   Timeout: prior to procedure the correct patient, procedure, and site was verified   Indications:  Pain and diagnostic evaluation Location:  Hip Site:  L hip joint Prep: patient was prepped and draped in usual sterile fashion   Needle Size:  22 G Approach:  Anterior Ultrasound Guidance: No   Fluoroscopic Guidance: No   Arthrogram: Yes   Medications:  4 mL lidocaine 2 %; 80 mg triamcinolone acetonide 40 MG/ML Aspiration  Attempted: Yes   Patient tolerance:  Patient tolerated the procedure well with no immediate complications  Arthrogram demonstrated excellent flow of contrast throughout the joint surface without extravasation or obvious defect.  The patient had relief of symptoms during the anesthetic phase of the injection.     No notes on file   Clinical History: No specialty comments available.  He reports that he has quit smoking. His smoking use included Cigarettes. He has a 15.00 pack-year smoking history. He quit smokeless tobacco use about 27 years ago. No results for input(s): HGBA1C, LABURIC in the last 8760 hours.  Objective:  VS:  HT:    WT:   BMI:     BP:126/71  HR:62bpm  TEMP: ( )  RESP:  Physical Exam  Musculoskeletal:  The patient lives with an antalgic gait to the left. He has stiffness in the hip with rotation with some groin pain.Manson Passey Exam Imaging: No results found.  Past Medical/Family/Surgical/Social History: Medications & Allergies reviewed per EMR Patient Active Problem List   Diagnosis Date Noted  . Unilateral primary osteoarthritis, left hip 09/18/2016   Past Medical History:  Diagnosis Date  . Dental crowns present    loose upper front crown  . GERD (gastroesophageal reflux disease)   . High cholesterol   . Numbness of foot    right toe   Family History  Problem Relation Age of Onset  . Colon cancer Father 1   Past Surgical History:  Procedure Laterality Date  . ANTERIOR CRUCIATE LIGAMENT REPAIR Right   . BACK SURGERY     fusion  . CHOLECYSTECTOMY  1997  . COLONOSCOPY    . EXTRACORPOREAL SHOCK WAVE LITHOTRIPSY Right 04/10/2014   Procedure: EXTRACORPOREAL SHOCK WAVE LITHOTRIPSY (ESWL);  Surgeon: Harle Stanford, MD;  Location: [facility];  Service: Urology;  Laterality: Right;  . LUMBAR LAMINECTOMY  2008  . ROTATOR CUFF REPAIR Bilateral 2001,2007   Social History   Occupational History  . Not on file.   Social History Main Topics  . Smoking  status: Former Smoker    Packs/day: 1.00    Years: 15.00    Types: Cigarettes  . Smokeless tobacco: Former Systems developer    Quit date: 04/10/1989  . Alcohol use Yes     Comment: socail  . Drug use: No  . Sexual activity: Yes

## 2016-09-18 DIAGNOSIS — M1612 Unilateral primary osteoarthritis, left hip: Secondary | ICD-10-CM | POA: Insufficient documentation

## 2016-09-18 MED ORDER — TRIAMCINOLONE ACETONIDE 40 MG/ML IJ SUSP
80.0000 mg | INTRAMUSCULAR | Status: AC | PRN
  Administered 2016-09-16: 80 mg via INTRA_ARTICULAR

## 2016-09-18 MED ORDER — LIDOCAINE HCL 2 % IJ SOLN
4.0000 mL | INTRAMUSCULAR | Status: AC | PRN
  Administered 2016-09-16: 4 mL

## 2017-02-03 ENCOUNTER — Ambulatory Visit (INDEPENDENT_AMBULATORY_CARE_PROVIDER_SITE_OTHER): Payer: BLUE CROSS/BLUE SHIELD

## 2017-02-03 ENCOUNTER — Ambulatory Visit (INDEPENDENT_AMBULATORY_CARE_PROVIDER_SITE_OTHER): Payer: BLUE CROSS/BLUE SHIELD | Admitting: Orthopaedic Surgery

## 2017-02-03 ENCOUNTER — Encounter (INDEPENDENT_AMBULATORY_CARE_PROVIDER_SITE_OTHER): Payer: Self-pay | Admitting: Orthopaedic Surgery

## 2017-02-03 DIAGNOSIS — M1612 Unilateral primary osteoarthritis, left hip: Secondary | ICD-10-CM

## 2017-02-03 NOTE — Progress Notes (Signed)
Office Visit Note   Patient: [name]           Date of Birth: [birth year]           MRN: [id number] Visit Date: 02/03/2017              Requested by: Moshe Cipro, MD [address] [place], [place] [address] PCP: Moshe Cipro, MD   Assessment & Plan: Visit Diagnoses:  1. Unilateral primary osteoarthritis, left hip     Plan: Updated x-rays today show advanced degenerative joint disease of the left hip. There is no lesions within the acetabulum on plain films. There is a PET scan report that shows a indolent lesion in the superior acetabulum. At this point I think that the patient is appropriate to undergo a hip replacement. We will plan on putting him on either Lovenox or Xarelto for 2-4 weeks postoperatively given his history of cancer. We will then back and down to aspirin 325 twice a day. He and his wife tell me today that his oncologist at [facility] has given him the go ahead to undergo hip replacement. They understand the risks benefits alternatives to surgery and wished proceed. We'll get him scheduled in the near future.  Follow-Up Instructions: Return if symptoms worsen or fail to improve.   Orders:  Orders Placed This Encounter  Procedures  . XR HIP UNILAT W OR W/O PELVIS 2-3 VIEWS LEFT   No orders of the defined types were placed in this encounter.     Procedures: No procedures performed   Clinical Data: No additional findings.   Subjective: Chief Complaint  Patient presents with  . Left Hip - Pain    [name] is a 61 year old who comes back for follow-up of his left hip degenerative joint disease. He has been given the go ahead by his oncologist at [facility] to pursue a left hip replacement. His multiple myeloma is stable. He is no longer on any treatments or chemotherapy for his multiple myeloma. He recently had a PET scan. He would like to go ahead and schedule hip replacement for early next month.    Review of Systems  Constitutional: Negative.     All other systems reviewed and are negative.    Objective: Vital Signs: There were no vitals taken for this visit.  Physical Exam  Constitutional: He is oriented to person, place, and time. He appears well-developed and well-nourished.  Pulmonary/Chest: Effort normal.  Abdominal: Soft.  Neurological: He is alert and oriented to person, place, and time.  Skin: Skin is warm.  Psychiatric: He has a normal mood and affect. His behavior is normal. Judgment and thought content normal.  Nursing note and vitals reviewed.   Ortho Exam Left hip exam shows painful range of motion with a positive Stinchfield sign. Specialty Comments:  No specialty comments available.  Imaging: Xr Hip Unilat W Or W/o Pelvis 2-3 Views Left  Result Date: 02/03/2017 Degenerative joint disease left hip.  No obvious signs of lesions within the acetabulum.    PMFS History: Patient Active Problem List   Diagnosis Date Noted  . Unilateral primary osteoarthritis, left hip 09/18/2016   Past Medical History:  Diagnosis Date  . Dental crowns present    loose upper front crown  . GERD (gastroesophageal reflux disease)   . High cholesterol   . Numbness of foot    right toe    Family History  Problem Relation Age of Onset  . Colon cancer Father 44  Past Surgical History:  Procedure Laterality Date  . ANTERIOR CRUCIATE LIGAMENT REPAIR Right   . BACK SURGERY     fusion  . CHOLECYSTECTOMY  1997  . COLONOSCOPY    . EXTRACORPOREAL SHOCK WAVE LITHOTRIPSY Right 04/10/2014   Procedure: EXTRACORPOREAL SHOCK WAVE LITHOTRIPSY (ESWL);  Surgeon: Harle Stanford, MD;  Location: [facility];  Service: Urology;  Laterality: Right;  . LUMBAR LAMINECTOMY  2008  . ROTATOR CUFF REPAIR Bilateral 2001,2007   Social History   Occupational History  . Not on file.   Social History Main Topics  . Smoking status: Former Smoker    Packs/day: 1.00    Years: 15.00    Types: Cigarettes  . Smokeless tobacco: Former Systems developer    Quit  date: 04/10/1989  . Alcohol use Yes     Comment: socail  . Drug use: No  . Sexual activity: Yes

## 2017-02-22 ENCOUNTER — Other Ambulatory Visit (INDEPENDENT_AMBULATORY_CARE_PROVIDER_SITE_OTHER): Payer: Self-pay | Admitting: Orthopaedic Surgery

## 2017-02-22 DIAGNOSIS — M1612 Unilateral primary osteoarthritis, left hip: Secondary | ICD-10-CM

## 2017-02-25 ENCOUNTER — Encounter (HOSPITAL_COMMUNITY)
Admission: RE | Admit: 2017-02-25 | Discharge: 2017-02-25 | Disposition: A | Discharge status: Home / Self Care | Payer: BLUE CROSS/BLUE SHIELD | Source: Ambulatory Visit | Attending: Orthopaedic Surgery | Admitting: Orthopaedic Surgery

## 2017-02-25 ENCOUNTER — Encounter (HOSPITAL_COMMUNITY): Payer: Self-pay

## 2017-02-25 DIAGNOSIS — Z01812 Encounter for preprocedural laboratory examination: Secondary | ICD-10-CM | POA: Diagnosis not present

## 2017-02-25 DIAGNOSIS — M1612 Unilateral primary osteoarthritis, left hip: Secondary | ICD-10-CM | POA: Insufficient documentation

## 2017-02-25 DIAGNOSIS — Z01810 Encounter for preprocedural cardiovascular examination: Secondary | ICD-10-CM | POA: Diagnosis not present

## 2017-02-25 HISTORY — DX: Malignant (primary) neoplasm, unspecified: C80.1

## 2017-02-25 HISTORY — DX: Unspecified osteoarthritis, unspecified site: M19.90

## 2017-02-25 HISTORY — DX: Personal history of urinary calculi: Z87.442

## 2017-02-25 HISTORY — DX: Disease of blood and blood-forming organs, unspecified: D75.9

## 2017-02-25 HISTORY — DX: Personal history of other diseases of the digestive system: Z87.19

## 2017-02-25 LAB — URINALYSIS, ROUTINE W REFLEX MICROSCOPIC
BILIRUBIN URINE: NEGATIVE
GLUCOSE, UA: NEGATIVE mg/dL
HGB URINE DIPSTICK: NEGATIVE
Ketones, ur: NEGATIVE mg/dL
Leukocytes, UA: NEGATIVE
Nitrite: NEGATIVE
PROTEIN: NEGATIVE mg/dL
Specific Gravity, Urine: 1.004 — ABNORMAL LOW (ref 1.005–1.030)
pH: 6 (ref 5.0–8.0)

## 2017-02-25 LAB — CBC WITH DIFFERENTIAL/PLATELET
BASOS PCT: 2 %
Basophils Absolute: 0.1 10*3/uL (ref 0.0–0.1)
EOS ABS: 0.2 10*3/uL (ref 0.0–0.7)
Eosinophils Relative: 6 %
HCT: 41.5 % (ref 39.0–52.0)
HEMOGLOBIN: 14 g/dL (ref 13.0–17.0)
Lymphocytes Relative: 20 %
Lymphs Abs: 0.6 10*3/uL — ABNORMAL LOW (ref 0.7–4.0)
MCH: 32.9 pg (ref 26.0–34.0)
MCHC: 33.7 g/dL (ref 30.0–36.0)
MCV: 97.6 fL (ref 78.0–100.0)
MONO ABS: 0.4 10*3/uL (ref 0.1–1.0)
MONOS PCT: 14 %
NEUTROS PCT: 58 %
Neutro Abs: 1.7 10*3/uL (ref 1.7–7.7)
PLATELETS: 184 10*3/uL (ref 150–400)
RBC: 4.25 MIL/uL (ref 4.22–5.81)
RDW: 13 % (ref 11.5–15.5)
WBC: 2.9 10*3/uL — ABNORMAL LOW (ref 4.0–10.5)

## 2017-02-25 LAB — SURGICAL PCR SCREEN
MRSA, PCR: NEGATIVE
Staphylococcus aureus: NEGATIVE

## 2017-02-25 LAB — COMPREHENSIVE METABOLIC PANEL
ALBUMIN: 4.3 g/dL (ref 3.5–5.0)
ALT: 39 U/L (ref 17–63)
ANION GAP: 7 (ref 5–15)
AST: 39 U/L (ref 15–41)
Alkaline Phosphatase: 53 U/L (ref 38–126)
BUN: 12 mg/dL (ref 6–20)
CHLORIDE: 106 mmol/L (ref 101–111)
CO2: 25 mmol/L (ref 22–32)
Calcium: 9 mg/dL (ref 8.9–10.3)
Creatinine, Ser: 0.89 mg/dL (ref 0.61–1.24)
GFR calc Af Amer: >60 mL/min (ref >60)
GFR calc non Af Amer: >60 mL/min (ref >60)
GLUCOSE: 93 mg/dL (ref 65–99)
Potassium: 3.9 mmol/L (ref 3.5–5.1)
SODIUM: 138 mmol/L (ref 135–145)
Total Bilirubin: 0.8 mg/dL (ref 0.3–1.2)
Total Protein: 6.3 g/dL — ABNORMAL LOW (ref 6.5–8.1)

## 2017-02-25 LAB — C-REACTIVE PROTEIN

## 2017-02-25 LAB — APTT: aPTT: 27 seconds (ref 24–36)

## 2017-02-25 LAB — SEDIMENTATION RATE: SED RATE: 12 mm/h (ref 0–16)

## 2017-02-25 LAB — PROTIME-INR
INR: 0.99
Prothrombin Time: 13.1 seconds (ref 11.4–15.2)

## 2017-02-25 LAB — TYPE AND SCREEN
ABO/RH(D): O POS
ANTIBODY SCREEN: NEGATIVE

## 2017-02-25 NOTE — Pre-Procedure Instructions (Addendum)
[name]  02/25/2017      [id number] - [address] [place] [address] Phone: [phone] Fax: [phone]    Your procedure is scheduled on 03/03/17  Report to [facility] Admitting at 1015 A.M.  Call this number if you have problems the morning of surgery:  [phone]   Remember:  Do not eat food or drink liquids after midnight.  Take these medicines the morning of surgery with A SIP OF WATER    Ranitidine(zantac)   STOP all herbel meds, nsaids (aleve,naproxen,advil,ibuprofen)  prior to surgery starting Today 02/25/17 including all vitamins/supplements,aspirin   Do not wear jewelry, make-up or nail polish.  Do not wear lotions, powders, or perfumes, or deoderant.  Do not shave 48 hours prior to surgery.  Men may shave face and neck.  Do not bring valuables to the hospital.  [facility] is not responsible for any belongings or valuables.  Contacts, dentures or bridgework may not be worn into surgery.  Leave your suitcase in the car.  After surgery it may be brought to your room.  For patients admitted to the hospital, discharge time will be determined by your treatment team.  Patients discharged the day of surgery will not be allowed to drive home.   Special instructions:   Special Instructions: [place] - Preparing for Surgery  Before surgery, you can play an important role.  Because skin is not sterile, your skin needs to be as free of germs as possible.  You can reduce the number of germs on you skin by washing with CHG (chlorahexidine gluconate) soap before surgery.  CHG is an antiseptic cleaner which kills germs and bonds with the skin to continue killing germs even after washing.  Please DO NOT use if you have an allergy to CHG or antibacterial soaps.  If your skin becomes reddened/irritated stop using the CHG and inform your nurse when you arrive at Short Stay.  Do not shave (including  legs and underarms) for at least 48 hours prior to the first CHG shower.  You may shave your face.  Please follow these instructions carefully:   1.  Shower with CHG Soap the night before surgery and the morning of Surgery.  2.  If you choose to wash your hair, wash your hair first as usual with your normal shampoo.  3.  After you shampoo, rinse your hair and body thoroughly to remove the Shampoo.  4.  Use CHG as you would any other liquid soap.  You can apply chg directly  to the skin and wash gently with scrungie or a clean washcloth.  5.  Apply the CHG Soap to your body ONLY FROM THE NECK DOWN.  Do not use on open wounds or open sores.  Avoid contact with your eyes ears, mouth and genitals (private parts).  Wash genitals (private parts)       with your normal soap.  6.  Wash thoroughly, paying special attention to the area where your surgery will be performed.  7.  Thoroughly rinse your body with warm water from the neck down.  8.  DO NOT shower/wash with your normal soap after using and rinsing off the CHG Soap.  9.  Pat yourself dry with a clean towel.            10.  Wear clean pajamas.            11.  Place clean sheets on your bed the night of your first shower and do not sleep with pets.  Day of Surgery  Do not apply any lotions/deodorants the morning of surgery.  Please wear clean clothes to the hospital/surgery center.  Please read over the  fact sheets that you were given.

## 2017-03-01 NOTE — Progress Notes (Addendum)
Anesthesia Chart Review:  Pt is a 61 year old male scheduled for L total hip arthroplasty anterior approach on 03/03/2017 with Frankey Shown, MD  - PCP is Moshe Cipro, MD - Oncologist is Akintunde Jodelle [place], [place]). - Undergoing work up for stem cell transplant at [facility] to occur when multiple myeloma worsens in the future (notes in care everywhere)  PMH includes:  Multiple myeloma, hyperlipidemia, GERD. Former smoker. BMI 25. S/p lumbar fusion 12/12/12.   Medications include: ASA 81 mg, Lipitor, Zantac  Preoperative labs reviewed.    EKG 02/25/17: Sinus bradycardia (54 bpm). RBBB.   - RBBB is noted on prior EKG reports in care everywhere  Echo 10/27/15 (care everywhere):  1. Normal LV systolic function. Next line 2. Normal LA pressures with normal diastolic function. 3. Normal RV systolic function. 4. Trivial MR, trivial TR. 5. No valvular stenosis.  I spoke with pt by telephone.  He has not undergone chemo treatment since fall 2016.  His multiple myeloma is slow growing, and at this point, his oncologist are simply monitoring it.  He says his oncologist told him it was ok for him to have surgery.   If no changes, I anticipate pt can proceed with surgery as scheduled.   Willeen Cass, FNP-BC [facility] Short Stay Surgical Center/Anesthesiology Phone: [phone] 03/02/2017 12:26 PM

## 2017-03-02 MED ORDER — TRANEXAMIC ACID 1000 MG/10ML IV SOLN
1000.0000 mg | INTRAVENOUS | Status: AC
  Filled 2017-03-02 (×2): qty 10

## 2017-03-03 ENCOUNTER — Inpatient Hospital Stay (HOSPITAL_COMMUNITY): Payer: BLUE CROSS/BLUE SHIELD

## 2017-03-03 ENCOUNTER — Encounter (HOSPITAL_COMMUNITY)
Admission: RE | Disposition: A | Discharge status: Home / Self Care | Payer: Self-pay | Source: Ambulatory Visit | Attending: Orthopaedic Surgery

## 2017-03-03 ENCOUNTER — Inpatient Hospital Stay (HOSPITAL_COMMUNITY)
Admission: RE | Admit: 2017-03-03 | Discharge: 2017-03-04 | DRG: 470 | Disposition: A | Discharge status: Home / Self Care | Payer: BLUE CROSS/BLUE SHIELD | Source: Ambulatory Visit | Attending: Orthopaedic Surgery | Admitting: Orthopaedic Surgery

## 2017-03-03 ENCOUNTER — Inpatient Hospital Stay (HOSPITAL_COMMUNITY): Payer: BLUE CROSS/BLUE SHIELD | Admitting: Anesthesiology

## 2017-03-03 ENCOUNTER — Encounter (HOSPITAL_COMMUNITY): Payer: Self-pay | Admitting: *Deleted

## 2017-03-03 ENCOUNTER — Inpatient Hospital Stay (HOSPITAL_COMMUNITY): Payer: BLUE CROSS/BLUE SHIELD | Admitting: Emergency Medicine

## 2017-03-03 DIAGNOSIS — Z87891 Personal history of nicotine dependence: Secondary | ICD-10-CM

## 2017-03-03 DIAGNOSIS — Z981 Arthrodesis status: Secondary | ICD-10-CM

## 2017-03-03 DIAGNOSIS — Z79899 Other long term (current) drug therapy: Secondary | ICD-10-CM

## 2017-03-03 DIAGNOSIS — D62 Acute posthemorrhagic anemia: Secondary | ICD-10-CM | POA: Diagnosis not present

## 2017-03-03 DIAGNOSIS — K219 Gastro-esophageal reflux disease without esophagitis: Secondary | ICD-10-CM | POA: Diagnosis present

## 2017-03-03 DIAGNOSIS — E7800 Pure hypercholesterolemia, unspecified: Secondary | ICD-10-CM | POA: Diagnosis present

## 2017-03-03 DIAGNOSIS — Z888 Allergy status to other drugs, medicaments and biological substances status: Secondary | ICD-10-CM

## 2017-03-03 DIAGNOSIS — Z8579 Personal history of other malignant neoplasms of lymphoid, hematopoietic and related tissues: Secondary | ICD-10-CM

## 2017-03-03 DIAGNOSIS — M1612 Unilateral primary osteoarthritis, left hip: Secondary | ICD-10-CM | POA: Diagnosis present

## 2017-03-03 DIAGNOSIS — Z419 Encounter for procedure for purposes other than remedying health state, unspecified: Secondary | ICD-10-CM

## 2017-03-03 DIAGNOSIS — Z7982 Long term (current) use of aspirin: Secondary | ICD-10-CM | POA: Diagnosis not present

## 2017-03-03 DIAGNOSIS — Z96649 Presence of unspecified artificial hip joint: Secondary | ICD-10-CM

## 2017-03-03 HISTORY — PX: TOTAL HIP ARTHROPLASTY: SHX124

## 2017-03-03 SURGERY — ARTHROPLASTY, HIP, TOTAL, ANTERIOR APPROACH
Anesthesia: Spinal | Site: Hip | Laterality: Left

## 2017-03-03 MED ORDER — DEXAMETHASONE SODIUM PHOSPHATE 10 MG/ML IJ SOLN
10.0000 mg | Freq: Once | INTRAMUSCULAR | Status: AC
  Administered 2017-03-04: 10 mg via INTRAVENOUS
  Filled 2017-03-03: qty 1

## 2017-03-03 MED ORDER — ATORVASTATIN CALCIUM 10 MG PO TABS
10.0000 mg | ORAL_TABLET | ORAL | Status: DC
  Filled 2017-03-03: qty 1

## 2017-03-03 MED ORDER — OXYCODONE HCL ER 10 MG PO T12A: 10.0000 mg | EXTENDED_RELEASE_TABLET | Freq: Two times a day (BID) | ORAL | 0 refills | Status: DC

## 2017-03-03 MED ORDER — PHENYLEPHRINE HCL 10 MG/ML IJ SOLN
INTRAMUSCULAR | Status: DC | PRN
  Administered 2017-03-03: 20 ug/min via INTRAVENOUS

## 2017-03-03 MED ORDER — LACTATED RINGERS IV SOLN
INTRAVENOUS | Status: DC | PRN
  Administered 2017-03-03 (×2): via INTRAVENOUS

## 2017-03-03 MED ORDER — CHLORHEXIDINE GLUCONATE 4 % EX LIQD
60.0000 mL | Freq: Once | CUTANEOUS | Status: DC
Start: 2017-03-03 — End: 2017-03-03

## 2017-03-03 MED ORDER — HYDROMORPHONE HCL 1 MG/ML IJ SOLN
0.2500 mg | INTRAMUSCULAR | Status: DC | PRN
  Administered 2017-03-03 (×2): 0.5 mg via INTRAVENOUS

## 2017-03-03 MED ORDER — MAGNESIUM CITRATE PO SOLN: 1.0000 | Freq: Once | ORAL | Status: DC | PRN

## 2017-03-03 MED ORDER — SODIUM CHLORIDE 0.9 % IV SOLN
INTRAVENOUS | Status: DC
  Administered 2017-03-03: 18:00:00 via INTRAVENOUS

## 2017-03-03 MED ORDER — FENTANYL CITRATE (PF) 250 MCG/5ML IJ SOLN
INTRAMUSCULAR | Status: AC
  Filled 2017-03-03: qty 5

## 2017-03-03 MED ORDER — MIDAZOLAM HCL 2 MG/2ML IJ SOLN
INTRAMUSCULAR | Status: AC
  Filled 2017-03-03: qty 2

## 2017-03-03 MED ORDER — PHENYLEPHRINE 40 MCG/ML (10ML) SYRINGE FOR IV PUSH (FOR BLOOD PRESSURE SUPPORT)
PREFILLED_SYRINGE | INTRAVENOUS | Status: DC | PRN
  Administered 2017-03-03: 40 ug via INTRAVENOUS
  Administered 2017-03-03: 80 ug via INTRAVENOUS
  Administered 2017-03-03: 40 ug via INTRAVENOUS
  Administered 2017-03-03: 80 ug via INTRAVENOUS

## 2017-03-03 MED ORDER — MIDAZOLAM HCL 2 MG/2ML IJ SOLN
INTRAMUSCULAR | Status: DC | PRN
  Administered 2017-03-03 (×2): 1 mg via INTRAVENOUS

## 2017-03-03 MED ORDER — CHLORHEXIDINE GLUCONATE 4 % EX LIQD: 60.0000 mL | Freq: Once | CUTANEOUS | Status: DC

## 2017-03-03 MED ORDER — CEFAZOLIN SODIUM-DEXTROSE 2-4 GM/100ML-% IV SOLN
2.0000 g | INTRAVENOUS | Status: AC
  Administered 2017-03-03: 2 g via INTRAVENOUS

## 2017-03-03 MED ORDER — LIDOCAINE 2% (20 MG/ML) 5 ML SYRINGE
INTRAMUSCULAR | Status: AC
  Filled 2017-03-03: qty 5

## 2017-03-03 MED ORDER — KETOROLAC TROMETHAMINE 15 MG/ML IJ SOLN
30.0000 mg | Freq: Four times a day (QID) | INTRAMUSCULAR | Status: AC
  Administered 2017-03-03 (×2): 30 mg via INTRAVENOUS
  Administered 2017-03-04: 15 mg via INTRAVENOUS
  Administered 2017-03-04: 30 mg via INTRAVENOUS
  Filled 2017-03-03: qty 1
  Filled 2017-03-03 (×4): qty 2

## 2017-03-03 MED ORDER — OXYCODONE HCL 5 MG PO TABS
ORAL_TABLET | ORAL | Status: AC
  Filled 2017-03-03: qty 1

## 2017-03-03 MED ORDER — LACTATED RINGERS IV SOLN
INTRAVENOUS | Status: DC
  Administered 2017-03-03: 11:00:00 via INTRAVENOUS

## 2017-03-03 MED ORDER — RIVAROXABAN 10 MG PO TABS
10.0000 mg | ORAL_TABLET | Freq: Every day | ORAL | Status: DC
  Administered 2017-03-04: 10 mg via ORAL
  Filled 2017-03-03: qty 1

## 2017-03-03 MED ORDER — METOCLOPRAMIDE HCL 5 MG/ML IJ SOLN: 5.0000 mg | Freq: Three times a day (TID) | INTRAMUSCULAR | Status: DC | PRN

## 2017-03-03 MED ORDER — SENNOSIDES-DOCUSATE SODIUM 8.6-50 MG PO TABS: 1.0000 | ORAL_TABLET | Freq: Every evening | ORAL | 1 refills | Status: DC | PRN

## 2017-03-03 MED ORDER — ONDANSETRON HCL 4 MG/2ML IJ SOLN: 4.0000 mg | Freq: Four times a day (QID) | INTRAMUSCULAR | Status: DC | PRN

## 2017-03-03 MED ORDER — ACETAMINOPHEN 650 MG RE SUPP: 650.0000 mg | Freq: Four times a day (QID) | RECTAL | Status: DC | PRN

## 2017-03-03 MED ORDER — PHENYLEPHRINE HCL 10 MG/ML IJ SOLN
INTRAMUSCULAR | Status: AC
  Filled 2017-03-03: qty 1

## 2017-03-03 MED ORDER — OXYCODONE HCL ER 15 MG PO T12A
15.0000 mg | EXTENDED_RELEASE_TABLET | Freq: Two times a day (BID) | ORAL | Status: DC
  Administered 2017-03-03 – 2017-03-04 (×2): 15 mg via ORAL
  Filled 2017-03-03 (×2): qty 1

## 2017-03-03 MED ORDER — SULFAMETHOXAZOLE-TRIMETHOPRIM 800-160 MG PO TABS: 1.0000 | ORAL_TABLET | Freq: Two times a day (BID) | ORAL | 0 refills | Status: DC

## 2017-03-03 MED ORDER — HYDROMORPHONE HCL 1 MG/ML IJ SOLN
INTRAMUSCULAR | Status: AC
  Filled 2017-03-03: qty 0.5

## 2017-03-03 MED ORDER — SODIUM CHLORIDE 0.9 % IR SOLN
Status: DC | PRN
  Administered 2017-03-03: 3000 mL

## 2017-03-03 MED ORDER — OXYCODONE HCL 5 MG PO TABS: 5.0000 mg | ORAL_TABLET | ORAL | 0 refills | Status: DC | PRN

## 2017-03-03 MED ORDER — TRANEXAMIC ACID 1000 MG/10ML IV SOLN
1000.0000 mg | INTRAVENOUS | Status: AC
  Administered 2017-03-03: 1000 mg via INTRAVENOUS
  Filled 2017-03-03: qty 10

## 2017-03-03 MED ORDER — SODIUM CHLORIDE 0.9 % IV SOLN
1000.0000 mg | Freq: Once | INTRAVENOUS | Status: AC
  Administered 2017-03-03: 1000 mg via INTRAVENOUS
  Filled 2017-03-03: qty 10

## 2017-03-03 MED ORDER — ONDANSETRON HCL 4 MG/2ML IJ SOLN
INTRAMUSCULAR | Status: AC
  Filled 2017-03-03: qty 2

## 2017-03-03 MED ORDER — PHENOL 1.4 % MT LIQD: 1.0000 | OROMUCOSAL | Status: DC | PRN

## 2017-03-03 MED ORDER — ASPIRIN EC 81 MG PO TBEC
81.0000 mg | DELAYED_RELEASE_TABLET | Freq: Every day | ORAL | Status: DC
  Administered 2017-03-04: 81 mg via ORAL
  Filled 2017-03-03: qty 1

## 2017-03-03 MED ORDER — MENTHOL 3 MG MT LOZG: 1.0000 | LOZENGE | OROMUCOSAL | Status: DC | PRN

## 2017-03-03 MED ORDER — PROMETHAZINE HCL 25 MG/ML IJ SOLN: 6.2500 mg | INTRAMUSCULAR | Status: DC | PRN

## 2017-03-03 MED ORDER — SORBITOL 70 % SOLN: 30.0000 mL | Freq: Every day | Status: DC | PRN

## 2017-03-03 MED ORDER — CEFAZOLIN SODIUM-DEXTROSE 2-4 GM/100ML-% IV SOLN
2.0000 g | Freq: Four times a day (QID) | INTRAVENOUS | Status: AC
  Administered 2017-03-03 (×2): 2 g via INTRAVENOUS
  Filled 2017-03-03 (×2): qty 100

## 2017-03-03 MED ORDER — POLYETHYLENE GLYCOL 3350 17 G PO PACK: 17.0000 g | PACK | Freq: Every day | ORAL | Status: DC | PRN

## 2017-03-03 MED ORDER — FENTANYL CITRATE (PF) 100 MCG/2ML IJ SOLN
INTRAMUSCULAR | Status: DC | PRN
Start: 2017-03-03 — End: 2017-03-03
  Administered 2017-03-03: 50 ug via INTRAVENOUS

## 2017-03-03 MED ORDER — 0.9 % SODIUM CHLORIDE (POUR BTL) OPTIME
TOPICAL | Status: DC | PRN
  Administered 2017-03-03: 1000 mL

## 2017-03-03 MED ORDER — RIVAROXABAN 10 MG PO TABS: 10.0000 mg | ORAL_TABLET | Freq: Every day | ORAL | 0 refills | Status: DC

## 2017-03-03 MED ORDER — ONDANSETRON HCL 4 MG PO TABS: 4.0000 mg | ORAL_TABLET | Freq: Four times a day (QID) | ORAL | Status: DC | PRN

## 2017-03-03 MED ORDER — ACETAMINOPHEN 500 MG PO TABS
1000.0000 mg | ORAL_TABLET | Freq: Four times a day (QID) | ORAL | Status: AC
  Administered 2017-03-03 – 2017-03-04 (×4): 1000 mg via ORAL
  Filled 2017-03-03 (×4): qty 2

## 2017-03-03 MED ORDER — METOCLOPRAMIDE HCL 5 MG PO TABS: 5.0000 mg | ORAL_TABLET | Freq: Three times a day (TID) | ORAL | Status: DC | PRN

## 2017-03-03 MED ORDER — MORPHINE SULFATE (PF) 4 MG/ML IV SOLN
1.0000 mg | INTRAVENOUS | Status: DC | PRN
  Administered 2017-03-03: 1 mg via INTRAVENOUS
  Filled 2017-03-03: qty 1

## 2017-03-03 MED ORDER — TRANEXAMIC ACID 1000 MG/10ML IV SOLN
INTRAVENOUS | Status: AC | PRN
  Administered 2017-03-03: 2000 mg via TOPICAL

## 2017-03-03 MED ORDER — TRANEXAMIC ACID 1000 MG/10ML IV SOLN
2000.0000 mg | Freq: Once | INTRAVENOUS | Status: AC
  Filled 2017-03-03: qty 20

## 2017-03-03 MED ORDER — METHOCARBAMOL 750 MG PO TABS: 750.0000 mg | ORAL_TABLET | Freq: Two times a day (BID) | ORAL | 0 refills | Status: DC | PRN

## 2017-03-03 MED ORDER — GLYCOPYRROLATE 0.2 MG/ML IV SOSY
PREFILLED_SYRINGE | INTRAVENOUS | Status: DC | PRN
  Administered 2017-03-03: .2 mg via INTRAVENOUS

## 2017-03-03 MED ORDER — GLYCOPYRROLATE 0.2 MG/ML IJ SOLN: INTRAMUSCULAR | Status: DC | PRN

## 2017-03-03 MED ORDER — ONDANSETRON HCL 4 MG PO TABS: 4.0000 mg | ORAL_TABLET | Freq: Three times a day (TID) | ORAL | 0 refills | Status: DC | PRN

## 2017-03-03 MED ORDER — METHOCARBAMOL 1000 MG/10ML IJ SOLN: 500.0000 mg | Freq: Four times a day (QID) | INTRAMUSCULAR | Status: DC | PRN

## 2017-03-03 MED ORDER — METHOCARBAMOL 500 MG PO TABS
ORAL_TABLET | ORAL | Status: AC
  Filled 2017-03-03: qty 1

## 2017-03-03 MED ORDER — PROPOFOL 10 MG/ML IV BOLUS
INTRAVENOUS | Status: AC
  Filled 2017-03-03: qty 20

## 2017-03-03 MED ORDER — BUPIVACAINE HCL (PF) 0.75 % IJ SOLN
INTRAMUSCULAR | Status: DC | PRN
  Administered 2017-03-03: 2 mL via INTRATHECAL

## 2017-03-03 MED ORDER — PHENYLEPHRINE 40 MCG/ML (10ML) SYRINGE FOR IV PUSH (FOR BLOOD PRESSURE SUPPORT)
PREFILLED_SYRINGE | INTRAVENOUS | Status: AC
  Filled 2017-03-03: qty 10

## 2017-03-03 MED ORDER — OXYCODONE HCL 5 MG PO TABS
5.0000 mg | ORAL_TABLET | ORAL | Status: DC | PRN
  Administered 2017-03-03: 5 mg via ORAL
  Administered 2017-03-03: 10 mg via ORAL
  Filled 2017-03-03 (×2): qty 2

## 2017-03-03 MED ORDER — ALUM & MAG HYDROXIDE-SIMETH 200-200-20 MG/5ML PO SUSP: 30.0000 mL | ORAL | Status: DC | PRN

## 2017-03-03 MED ORDER — ONDANSETRON HCL 4 MG/2ML IJ SOLN
INTRAMUSCULAR | Status: DC | PRN
  Administered 2017-03-03: 4 mg via INTRAVENOUS

## 2017-03-03 MED ORDER — ACETAMINOPHEN 325 MG PO TABS: 650.0000 mg | ORAL_TABLET | Freq: Four times a day (QID) | ORAL | Status: DC | PRN

## 2017-03-03 MED ORDER — VITAMIN D3 25 MCG (1000 UNIT) PO TABS
2000.0000 [IU] | ORAL_TABLET | Freq: Every day | ORAL | Status: DC
  Administered 2017-03-03 – 2017-03-04 (×2): 2000 [IU] via ORAL
  Filled 2017-03-03 (×4): qty 2

## 2017-03-03 MED ORDER — PROPOFOL 500 MG/50ML IV EMUL
INTRAVENOUS | Status: DC | PRN
  Administered 2017-03-03: 100 ug/kg/min via INTRAVENOUS

## 2017-03-03 MED ORDER — DIPHENHYDRAMINE HCL 12.5 MG/5ML PO ELIX: 25.0000 mg | ORAL_SOLUTION | ORAL | Status: DC | PRN

## 2017-03-03 MED ORDER — METHOCARBAMOL 500 MG PO TABS
500.0000 mg | ORAL_TABLET | Freq: Four times a day (QID) | ORAL | Status: DC | PRN
  Administered 2017-03-03: 500 mg via ORAL

## 2017-03-03 SURGICAL SUPPLY — 42 items
BAG DECANTER FOR FLEXI CONT (MISCELLANEOUS) ×3 IMPLANT
CAPT HIP TOTAL 2 ×3 IMPLANT
CELLS DAT CNTRL 66122 CELL SVR (MISCELLANEOUS) IMPLANT
CLOSURE WOUND 1/2 X4 (GAUZE/BANDAGES/DRESSINGS) ×1
COVER SURGICAL LIGHT HANDLE (MISCELLANEOUS) ×3 IMPLANT
DRAPE C-ARM 42X72 X-RAY (DRAPES) ×3 IMPLANT
DRAPE STERI IOBAN 125X83 (DRAPES) ×3 IMPLANT
DRAPE U-SHAPE 47X51 STRL (DRAPES) ×6 IMPLANT
DRSG AQUACEL AG ADV 3.5X10 (GAUZE/BANDAGES/DRESSINGS) ×3 IMPLANT
DURAPREP 26ML APPLICATOR (WOUND CARE) ×6 IMPLANT
ELECT BLADE 4.0 EZ CLEAN MEGAD (MISCELLANEOUS) ×3
ELECT REM PT RETURN 9FT ADLT (ELECTROSURGICAL) ×3
ELECTRODE BLDE 4.0 EZ CLN MEGD (MISCELLANEOUS) ×1 IMPLANT
ELECTRODE REM PT RTRN 9FT ADLT (ELECTROSURGICAL) ×1 IMPLANT
GLOVE SKINSENSE NS SZ7.5 (GLOVE) ×2
GLOVE SKINSENSE STRL SZ7.5 (GLOVE) ×1 IMPLANT
GLOVE SURG SYN 7.5  E (GLOVE) ×12
GLOVE SURG SYN 7.5 E (GLOVE) ×6 IMPLANT
GOWN SRG XL XLNG 56XLVL 4 (GOWN DISPOSABLE) ×1 IMPLANT
GOWN STRL NON-REIN XL XLG LVL4 (GOWN DISPOSABLE) ×2
GOWN STRL REUS W/ TWL LRG LVL3 (GOWN DISPOSABLE) ×2 IMPLANT
GOWN STRL REUS W/TWL LRG LVL3 (GOWN DISPOSABLE) ×4
HANDPIECE INTERPULSE COAX TIP (DISPOSABLE) ×2
HOOD PEEL AWAY FLYTE STAYCOOL (MISCELLANEOUS) ×6 IMPLANT
IV NS IRRIG 3000ML ARTHROMATIC (IV SOLUTION) ×3 IMPLANT
KIT BASIN OR (CUSTOM PROCEDURE TRAY) ×3 IMPLANT
MARKER SKIN DUAL TIP RULER LAB (MISCELLANEOUS) ×3 IMPLANT
PACK TOTAL JOINT (CUSTOM PROCEDURE TRAY) ×3 IMPLANT
PACK UNIVERSAL I (CUSTOM PROCEDURE TRAY) ×3 IMPLANT
RTRCTR WOUND ALEXIS 18CM MED (MISCELLANEOUS)
SAW OSC TIP CART 19.5X105X1.3 (SAW) ×3 IMPLANT
SET HNDPC FAN SPRY TIP SCT (DISPOSABLE) ×1 IMPLANT
STAPLER VISISTAT 35W (STAPLE) IMPLANT
STRIP CLOSURE SKIN 1/2X4 (GAUZE/BANDAGES/DRESSINGS) ×2 IMPLANT
SUT ETHIBOND 2 V 37 (SUTURE) ×3 IMPLANT
SUT VIC AB 1 CT1 27 (SUTURE) ×2
SUT VIC AB 1 CT1 27XBRD ANBCTR (SUTURE) ×1 IMPLANT
SUT VIC AB 2-0 CT1 27 (SUTURE) ×4
SUT VIC AB 2-0 CT1 TAPERPNT 27 (SUTURE) ×2 IMPLANT
TOWEL OR 17X26 10 PK STRL BLUE (TOWEL DISPOSABLE) ×3 IMPLANT
TRAY CATH 16FR W/PLASTIC CATH (SET/KITS/TRAYS/PACK) ×3 IMPLANT
YANKAUER SUCT BULB TIP NO VENT (SUCTIONS) ×3 IMPLANT

## 2017-03-03 NOTE — Anesthesia Procedure Notes (Signed)
Spinal  Patient location during procedure: OR Start time: 03/03/2017 12:12 PM End time: 03/03/2017 12:20 PM Staffing Anesthesiologist: Rica Koyanagi Performed: anesthesiologist  Preanesthetic Checklist Completed: patient identified, site marked, surgical consent, pre-op evaluation, timeout performed, IV checked, risks and benefits discussed and monitors and equipment checked Spinal Block Patient position: sitting Prep: Betadine Patient monitoring: heart rate, cardiac monitor, continuous pulse ox and blood pressure Approach: midline Location: L3-4 Injection technique: single-shot Needle Needle type: Pencan  Needle gauge: 24 G Needle length: 9 cm Needle insertion depth: 4 cm Assessment Sensory level: T6

## 2017-03-03 NOTE — H&P (Signed)
PREOPERATIVE H&P  Chief Complaint: left hip degenerative joint disease  HPI: [name] is a 61 y.o. male who presents for surgical treatment of left hip degenerative joint disease.  He denies any changes in medical history.  Past Medical History:  Diagnosis Date  . Arthritis   . Blood dyscrasia    multiples myloma  . Cancer ([place])    multiple myloma  . Dental crowns present    loose upper front crown  . GERD (gastroesophageal reflux disease)   . High cholesterol   . History of hiatal hernia   . History of kidney stones   . Numbness of foot    right toe   Past Surgical History:  Procedure Laterality Date  . ANTERIOR CRUCIATE LIGAMENT REPAIR Right   . BACK SURGERY     fusion  . CHOLECYSTECTOMY  1997  . COLONOSCOPY    . EXTRACORPOREAL SHOCK WAVE LITHOTRIPSY Right 04/10/2014   Procedure: EXTRACORPOREAL SHOCK WAVE LITHOTRIPSY (ESWL);  Surgeon: Harle Stanford, MD;  Location: [facility];  Service: Urology;  Laterality: Right;  . LUMBAR LAMINECTOMY  2008  . ROTATOR CUFF REPAIR Bilateral 2001,2007   Social History   Social History  . Marital status: Married    Spouse name: N/A  . Number of children: N/A  . Years of education: N/A   Social History Main Topics  . Smoking status: Former Smoker    Packs/day: 1.00    Years: 15.00    Types: Cigarettes  . Smokeless tobacco: Former Systems developer    Quit date: 04/10/1989  . Alcohol use Yes     Comment: socially  . Drug use: No  . Sexual activity: Yes   Other Topics Concern  . None   Social History Narrative  . None   Family History  Problem Relation Age of Onset  . Colon cancer Father 46   Allergies  Allergen Reactions  . Niacin Other (See Comments)    Flushing, insomnia  . Multihance [Gadobenate] Hives  . Zocor [Simvastatin] Other (See Comments)    Muscle cramping   Prior to Admission medications   Medication Sig Start Date End Date Taking? Authorizing Provider  aspirin EC 81 MG tablet Take 81 mg by mouth daily.    Yes [provider]  atorvastatin (LIPITOR) 10 MG tablet Take 10 mg by mouth every other day. In the morning.   Yes [provider]  B Complex Vitamins (VITAMIN B COMPLEX PO) Take 1 tablet by mouth daily.   Yes [provider]  Cholecalciferol (VITAMIN D3) 2000 units TABS Take 2,000 Units by mouth daily.   Yes [provider]  ibuprofen (ADVIL,MOTRIN) 200 MG tablet Take 200-400 mg by mouth every 6 (six) hours as needed (for pain.).   Yes [provider]  ranitidine (ZANTAC) 150 MG tablet Take 150 mg by mouth daily.   Yes [provider]     Positive ROS: All other systems have been reviewed and were otherwise negative with the exception of those mentioned in the HPI and as above.  Physical Exam: General: Alert, no acute distress Cardiovascular: No pedal edema Respiratory: No cyanosis, no use of accessory musculature GI: abdomen soft Skin: No lesions in the area of chief complaint Neurologic: Sensation intact distally Psychiatric: Patient is competent for consent with normal mood and affect Lymphatic: no lymphedema  MUSCULOSKELETAL: exam stable  Assessment: left hip degenerative joint disease  Plan: Plan for Procedure(s): LEFT TOTAL HIP ARTHROPLASTY ANTERIOR APPROACH  The risks benefits  and alternatives were discussed with the patient including but not limited to the risks of nonoperative treatment, versus surgical intervention including infection, bleeding, nerve injury,  blood clots, cardiopulmonary complications, morbidity, mortality, among others, and they were willing to proceed.   Eduard Roux, MD   03/03/2017 10:45 AM

## 2017-03-03 NOTE — Anesthesia Preprocedure Evaluation (Addendum)
Anesthesia Evaluation  Patient identified by MRN, date of birth, ID band Patient awake    Reviewed: Allergy & Precautions, NPO status , Patient's Chart, lab work & pertinent test results  History of Anesthesia Complications Negative for: history of anesthetic complications  Airway Mallampati: II  TM Distance: >3 FB Neck ROM: Full    Dental  (+) Caps, Partial Lower, Dental Advisory Given,    Pulmonary former smoker,    breath sounds clear to auscultation       Cardiovascular  Rhythm:Regular Rate:Normal     Neuro/Psych    GI/Hepatic hiatal hernia, GERD  ,  Endo/Other    Renal/GU      Musculoskeletal  (+) Arthritis , Hx of back surgeries   Abdominal   Peds  Hematology   Anesthesia Other Findings   Reproductive/Obstetrics                           Anesthesia Physical Anesthesia Plan  ASA: II  Anesthesia Plan: Spinal   Post-op Pain Management:    Induction: Intravenous  Airway Management Planned: Natural Airway and Simple Face Mask  Additional Equipment:   Intra-op Plan:   Post-operative Plan:   Informed Consent: I have reviewed the patients History and Physical, chart, labs and discussed the procedure including the risks, benefits and alternatives for the proposed anesthesia with the patient or authorized representative who has indicated his/her understanding and acceptance.   Dental advisory given  Plan Discussed with: CRNA  Anesthesia Plan Comments:        Anesthesia Quick Evaluation

## 2017-03-03 NOTE — Transfer of Care (Signed)
Immediate Anesthesia Transfer of Care Note  Patient: [name]  Procedure(s) Performed: Procedure(s): LEFT TOTAL HIP ARTHROPLASTY ANTERIOR APPROACH (Left)  Patient Location: PACU  Anesthesia Type:MAC and Spinal  Level of Consciousness: awake, alert  and oriented  Airway & Oxygen Therapy: Patient Spontanous Breathing  Post-op Assessment: Report given to RN and Post -op Vital signs reviewed and stable  Post vital signs: Reviewed and stable  Last Vitals:  Vitals:   03/03/17 1024  BP: 126/68  Pulse: 60  Resp: 18  Temp: 36.7 C    Last Pain:  Vitals:   03/03/17 1024  TempSrc: Oral         Complications: No apparent anesthesia complications

## 2017-03-03 NOTE — Op Note (Signed)
LEFT TOTAL HIP ARTHROPLASTY ANTERIOR APPROACH  Procedure Note [name]   [id number]  Pre-op Diagnosis: left hip degenerative joint disease     Post-op Diagnosis: same   Operative Procedures  1. Total hip replacement; Left hip; uncemented [id number]   Personnel  Surgeon(s): Leandrew Koyanagi, MD   Anesthesia: spinal  Prosthesis: Depuy Acetabulum: Pinnacle 58 mm Femur: Corail KA 11 Head: 36 size: +5 Liner: neutral Bearing Type: ceramic on poly  Total Hip Arthroplasty (Anterior Approach) Op Note:  After informed consent was obtained and the operative extremity marked in the holding area, the patient was brought back to the operating room and placed supine on the HANA table. Next, the operative extremity was prepped and draped in normal sterile fashion. Surgical timeout occurred verifying patient identification, surgical site, surgical procedure and administration of antibiotics.  A modified anterior Scholer-Peterson approach to the hip was performed, using the interval between tensor fascia lata and sartorius.  Dissection was carried bluntly down onto the anterior hip capsule. The lateral femoral circumflex vessels were identified and coagulated. A capsulotomy was performed and the capsular flaps tagged for later repair.  Fluoroscopy was utilized to prepare for the femoral neck cut. The neck osteotomy was performed. The femoral head was removed, the acetabular rim was cleared of soft tissue and attention was turned to reaming the acetabulum.  Sequential reaming was performed under fluoroscopic guidance. We reamed to a size 58 mm, and then impacted the acetabular shell. The liner was then placed after irrigation and attention turned to the femur.  After placing the femoral hook, the leg was taken to externally rotated, extended and adducted position taking care to perform soft tissue releases to allow for adequate mobilization of the femur. Soft tissue was cleared from the shoulder of the  greater trochanter and the hook elevator used to improve exposure of the proximal femur. Sequential broaching performed up to a size 11. Trial neck and head were placed. The leg was brought back up to neutral and the construct reduced. The position and sizing of components, offset and leg lengths were checked using fluoroscopy. Stability of the  construct was checked in extension and external rotation without any subluxation or impingement of prosthesis. We dislocated the prosthesis, dropped the leg back into position, removed trial components, and irrigated copiously. The final stem and head was then placed, the leg brought back up, the system reduced and fluoroscopy used to verify positioning.  We irrigated, obtained hemostasis and closed the capsule using #2 ethibond suture.  Dilute betadyne solution was used. The fascia was closed with #1 vicryl plus, the deep fat layer was closed with 0 vicryl, the subcutaneous layers closed with 2.0 Vicryl Plus and the skin closed with 3.0 monocryl and steri strips. A sterile dressing was applied. The patient was awakened in the operating room and taken to recovery in stable condition.  All sponge, needle, and instrument counts were correct at the end of the case.   Position: supine  Complications: none.  Time Out: performed   Drains/Packing: none  Estimated blood loss: 200 cc  Returned to Recovery Room: in good condition.   Antibiotics: yes   Mechanical VTE (DVT) Prophylaxis: sequential compression devices, TED thigh-high  Chemical VTE (DVT) Prophylaxis: xarelto   Fluid Replacement: see anesthesia record  Specimens Removed: 1 to pathology   Sponge and Instrument Count Correct? yes   PACU: portable radiograph - low [facility]   Admission: inpatient status  Plan/RTC: Return in 2 weeks for  staple removal. Weight Bearing/Load Lower Extremity: full  Hip precautions: none Suture Removal: 10-14 days  Betadine to incision twice daily once dressing is removed  on POD#7  N. Eduard Roux, MD [place] 1:54 PM      Implant Name Type Inv. Item Serial No. Manufacturer Lot No. LRB No. Used  LINER NEUTRAL [id number] - [id number] Liner LINER NEUTRAL [id number]  DEPUY SYNTHES [id number] Left 1  CUP SECTOR GRIPTON 58MM - [id number] Orthopedic Implant CUP SECTOR GRIPTON 58MM  DEPUY SYNTHES [id number] Left 1  HEAD CERAMIC 36 PLUS5 - [id number] Hips HEAD CERAMIC 36 PLUS5  DEPUY SYNTHES [id number] Left 1  STEM CORAIL KA11 - [id number] Stem STEM CORAIL KA11   DEPUY SYNTHES [id number] Left 1

## 2017-03-03 NOTE — Anesthesia Procedure Notes (Signed)
Procedure Name: MAC Date/Time: 03/03/2017 12:15 PM Performed by: Garrison [place] T Pre-anesthesia Checklist: Patient identified, Emergency Drugs available, Suction available and Patient being monitored Patient Re-evaluated:Patient Re-evaluated prior to inductionOxygen Delivery Method: Simple face mask Preoxygenation: Pre-oxygenation with 100% oxygen Intubation Type: IV induction Placement Confirmation: positive ETCO2 and breath sounds checked- equal and bilateral Dental Injury: Teeth and Oropharynx as per pre-operative assessment

## 2017-03-04 ENCOUNTER — Encounter (HOSPITAL_COMMUNITY): Payer: Self-pay | Admitting: Orthopaedic Surgery

## 2017-03-04 LAB — BASIC METABOLIC PANEL
ANION GAP: 5 (ref 5–15)
BUN: 9 mg/dL (ref 6–20)
CALCIUM: 8.8 mg/dL — AB (ref 8.9–10.3)
CHLORIDE: 101 mmol/L (ref 101–111)
CO2: 28 mmol/L (ref 22–32)
CREATININE: 0.92 mg/dL (ref 0.61–1.24)
GFR calc non Af Amer: >60 mL/min (ref >60)
Glucose, Bld: 118 mg/dL — ABNORMAL HIGH (ref 65–99)
Potassium: 3.5 mmol/L (ref 3.5–5.1)
SODIUM: 134 mmol/L — AB (ref 135–145)

## 2017-03-04 LAB — CBC
HCT: 38.3 % — ABNORMAL LOW (ref 39.0–52.0)
HEMOGLOBIN: 12.4 g/dL — AB (ref 13.0–17.0)
MCH: 32.1 pg (ref 26.0–34.0)
MCHC: 32.4 g/dL (ref 30.0–36.0)
MCV: 99.2 fL (ref 78.0–100.0)
Platelets: 128 10*3/uL — ABNORMAL LOW (ref 150–400)
RBC: 3.86 MIL/uL — ABNORMAL LOW (ref 4.22–5.81)
RDW: 13.6 % (ref 11.5–15.5)
WBC: 4.9 10*3/uL (ref 4.0–10.5)

## 2017-03-04 MED ORDER — ATORVASTATIN CALCIUM 10 MG PO TABS
10.0000 mg | ORAL_TABLET | ORAL | Status: DC
  Administered 2017-03-04: 10 mg via ORAL
  Filled 2017-03-04: qty 1

## 2017-03-04 MED ORDER — KETOROLAC TROMETHAMINE 15 MG/ML IJ SOLN
15.0000 mg | Freq: Once | INTRAMUSCULAR | Status: AC
  Administered 2017-03-04: 15 mg via INTRAVENOUS
  Filled 2017-03-04: qty 1

## 2017-03-04 NOTE — Progress Notes (Signed)
Reviewed discharge instructions including RX with pt verb understanding. Pt left via wheelchair in NAD with all belongings.

## 2017-03-04 NOTE — Progress Notes (Signed)
Pt is on continuous pulse ox and patient verbalized that every time he closed his eyes he was awoken by the sound of his pulse ox machine going off and )2 sat in 80s. Placed patient on 2L O2 at this time. Resting w/ eyes closed upon observance and O2 sat 98%. Will leave O2 on overnight.

## 2017-03-04 NOTE — Anesthesia Postprocedure Evaluation (Signed)
Anesthesia Post Note  Patient: KEITA [place]  Procedure(s) Performed: Procedure(s) (LRB): LEFT TOTAL HIP ARTHROPLASTY ANTERIOR APPROACH (Left)     Patient location during evaluation: PACU Anesthesia Type: Spinal Level of consciousness: oriented and awake and alert Pain management: pain level controlled Vital Signs Assessment: post-procedure vital signs reviewed and stable Respiratory status: spontaneous breathing, respiratory function stable and patient connected to nasal cannula oxygen Cardiovascular status: blood pressure returned to baseline and stable Postop Assessment: no headache and no backache Anesthetic complications: no    Last Vitals:  Vitals:   03/04/17 0405 03/04/17 1300  BP: (!) 113/55 (!) 125/56  Pulse: 62 73  Resp: 20 20  Temp: 36.7 C 36.7 C    Last Pain:  Vitals:   03/04/17 1300  TempSrc: Oral  PainSc:                  Kelia Gibbon,JAMES TERRILL

## 2017-03-04 NOTE — Evaluation (Signed)
Physical Therapy Evaluation Patient Details Name: [name] MRN: [id number] DOB: [birth year] Today's Date: 03/04/2017   History of Present Illness  Pt is a 61 yo male with c/o L hip pain secondary to end stage hip OA, s/p L THA anterion approach 03/03/17. PMH of OA, and CA.   Clinical Impression  Pt is s/p THA resulting in the deficits listed below (see PT Problem List). Pt is min guard for transfers and ambulation of 440 feet with RW. PT will work with pt on stair training of 14 steps in afternoon session. If patient safe with stair training pt would be ready for discharge today.  Pt will benefit from skilled PT to increase their independence and safety with mobility to allow discharge to the venue listed below.      Follow Up Recommendations Outpatient PT;Supervision/Assistance - 24 hour    Equipment Recommendations  None recommended by PT    Recommendations for Other Services       Precautions / Restrictions Precautions Precautions: Anterior Hip Precaution Booklet Issued: Yes (comment) Precaution Comments: booklet issued Restrictions Weight Bearing Restrictions: No LLE Weight Bearing: Weight bearing as tolerated      Mobility  Bed Mobility               General bed mobility comments: in recliner at entry  Transfers Overall transfer level: Needs assistance Equipment used: Rolling walker (2 wheeled) Transfers: Sit to/from Stand Sit to Stand: Min guard         General transfer comment: min guard for safety, vc for hand placement for push off and descent into recliner  Ambulation/Gait Ambulation/Gait assistance: Min guard Ambulation Distance (Feet): 440 Feet Assistive device: Rolling walker (2 wheeled) Gait Pattern/deviations: Step-through pattern;Step-to pattern;Narrow base of support Gait velocity: slowed Gait velocity interpretation: Below normal speed for age/gender General Gait Details: min guard for safety pt ambulated with slow steady cadence,  progressing from step-to to step-through pattern, no buckling, no LoB, vc for increased BoS         Balance Overall balance assessment: Modified Independent                                           Pertinent Vitals/Pain Pain Assessment: 0-10 Pain Score: 1  Pain Location: L hip surgical site Pain Descriptors / Indicators: Constant;Aching  VSS    Home Living Family/patient expects to be discharged to:: Private residence Living Arrangements: Spouse/significant other Available Help at Discharge: Family;Available 24 hours/day Type of Home: House Home Access: Stairs to enter Entrance Stairs-Rails: Psychiatric nurse of Steps: 3 Home Layout: Two level;Bed/bath upstairs Home Equipment: Clinical cytogeneticist - 2 wheels;Bedside commode;Cane - quad;Cane - single point      Prior Function Level of Independence: Independent         Comments: community ambulator and driver        Extremity/Trunk Assessment   Upper Extremity Assessment Upper Extremity Assessment: Overall WFL for tasks assessed    Lower Extremity Assessment Lower Extremity Assessment: LLE deficits/detail LLE Deficits / Details: L hip ROM and strength limited by surgical pain L knee and ankle ROM WFL and strength grossly 4/5 LLE: Unable to fully assess due to pain LLE Sensation:  (intact)    Cervical / Trunk Assessment Cervical / Trunk Assessment: Normal  Communication   Communication: No difficulties  Cognition Arousal/Alertness: Awake/alert Behavior During Therapy: WFL for tasks assessed/performed Overall  Cognitive Status: Within Functional Limits for tasks assessed                                           Exercises Total Joint Exercises Ankle Circles/Pumps: AROM;Both;10 reps Hip ABduction/ADduction: AROM;Left;10 reps;Standing Long Arc Quad: AROM;10 reps;Left;Standing Knee Flexion: AROM;Left;10 reps;Standing Marching in Standing: AROM;Left;10  reps;Standing Standing Hip Extension: AROM;Left;10 reps;Standing   Assessment/Plan    PT Assessment Patient needs continued PT services  PT Problem List Decreased strength;Decreased activity tolerance;Pain;Decreased range of motion;Decreased mobility       PT Treatment Interventions DME instruction;Gait training;Stair training;Functional mobility training;Therapeutic exercise;Patient/family education    PT Goals (Current goals can be found in the Care Plan section)  Acute Rehab PT Goals Patient Stated Goal: go home PT Goal Formulation: With patient Time For Goal Achievement: 03/18/17 Potential to Achieve Goals: Good    Frequency 7X/week    AM-PAC PT "6 Clicks" Daily Activity  Outcome Measure Difficulty turning over in bed (including adjusting bedclothes, sheets and blankets)?: A Little Difficulty moving from lying on back to sitting on the side of the bed? : A Little Difficulty sitting down on and standing up from a chair with arms (e.g., wheelchair, bedside commode, etc,.)?: A Little Help needed moving to and from a bed to chair (including a wheelchair)?: None Help needed walking in hospital room?: None Help needed climbing 3-5 steps with a railing? : A Little 6 Click Score: 20    End of Session Equipment Utilized During Treatment: Gait belt Activity Tolerance: Patient tolerated treatment well Patient left: in chair;with call bell/phone within reach;with family/visitor present Nurse Communication: Mobility status;Precautions;Weight bearing status;Other (comment) (desire for outpatient PT at discharge) PT Visit Diagnosis: Pain;Other abnormalities of gait and mobility (R26.89) Pain - Right/Left: Left Pain - part of body: Hip    Time: 9794-8016 PT Time Calculation (min) (ACUTE ONLY): 34 min   Charges:   PT Evaluation $PT Eval Low Complexity: 1 Procedure PT Treatments $Gait Training: 8-22 mins   PT G Codes:        Joelie Schou B. Migdalia Dk PT, DPT Acute Rehabilitation   [phone] Pager [phone]    [place] 03/04/2017, 9:04 AM

## 2017-03-04 NOTE — Care Management Note (Signed)
Case Management Note  Patient Details  Name: SELIM DURDEN MRN: [id number] Date of Birth: 1956/05/11  Subjective/Objective:   61 yr old gentleman s/p left total hip arthroplasty.                 Action/Plan: Case manager spoke with patient and his wife concerning discharge plan. Patient has requested to go to outpatient therapy rather than have Home Health. He will have someone available to transport him. Case manager informed patient that Dr.Xu would only agree to outpatient if patient had someone to take him. He has RW, CM has requested a 3in1. Patient wants to go to [facility] in [place]. CM will contact them to arrange therapy.   Expected Discharge Date:   56/1/18               Expected Discharge Plan:  Home/Self Care (will go to outpatient therapy)  In-House Referral:  NA  Discharge planning Services  CM Consult  Post Acute Care Choice:  Durable Medical Equipment Choice offered to:  Patient, Spouse  DME Arranged:  3-N-1 DME Agency:  [place]:  NA [place] Agency:  NA  Status of Service:  Completed, signed off  If discussed at [place] of Stay Meetings, dates discussed:    Additional Comments:  CM contacted [facility] at [phone], appointment has been made for patient for Monday, June 4,2018 at 1pm for start of phyical therapy. CM will fax orders and pertinent information to Larey Brick at [phone]. CM will put information on AVS .  Ninfa Meeker, RN 03/04/2017, 12:21 PM

## 2017-03-04 NOTE — Progress Notes (Signed)
   Subjective:  Patient reports pain as mild.  No events.  Objective:   VITALS:   Vitals:   03/03/17 2329 03/04/17 0121 03/04/17 0405 03/04/17 0407  BP: (!) 101/56  (!) 113/55   Pulse: 93  62   Resp: 18  20   Temp: 98.1 F (36.7 C)  98.1 F (36.7 C)   TempSrc: Oral  Oral   SpO2: 96% 98% 100% 96%  Weight:      Height:        Neurologically intact Neurovascular intact Sensation intact distally Intact pulses distally Dorsiflexion/Plantar flexion intact Incision: dressing C/D/I and no drainage No cellulitis present Compartment soft   Lab Results  Component Value Date   WBC 2.9 (L) 02/25/2017   HGB 14.0 02/25/2017   HCT 41.5 02/25/2017   MCV 97.6 02/25/2017   PLT 184 02/25/2017     Assessment/Plan:  1 Day Post-Op   - Expected postop acute blood loss anemia - will monitor for symptoms - Up with PT/OT - patient wants to go home today - please work on stair training when appropriate - DVT ppx - SCDs, ambulation, xarelto - WBAT operative extremity - Pain controlled  Eduard Roux 03/04/2017, 7:40 AM [phone]

## 2017-03-04 NOTE — Discharge Instructions (Signed)
INSTRUCTIONS AFTER JOINT REPLACEMENT   o Remove items at home which could result in a fall. This includes throw rugs or furniture in walking pathways o ICE to the affected joint every three hours while awake for 30 minutes at a time, for at least the first 3-5 days, and then as needed for pain and swelling.  Continue to use ice for pain and swelling. You may notice swelling that will progress down to the foot and ankle.  This is normal after surgery.  Elevate your leg when you are not up walking on it.   o Continue to use the breathing machine you got in the hospital (incentive spirometer) which will help keep your temperature down.  It is common for your temperature to cycle up and down following surgery, especially at night when you are not up moving around and exerting yourself.  The breathing machine keeps your lungs expanded and your temperature down.   DIET:  As you were doing prior to hospitalization, we recommend a well-balanced diet.  DRESSING / WOUND CARE / SHOWERING  You may change your surgical dressing 7 days after surgery.  Then change the dressing every day with sterile gauze.  Please use good hand washing techniques before changing the dressing.  Do not use any lotions or creams on the incision until instructed by your surgeon.  You may shower while you have the surgical dressing which is waterproof.  After removal of surgical dressing, you must cover the incision when showering.  ACTIVITY  o Increase activity slowly as tolerated, but follow the weight bearing instructions below.   o No driving for 6 weeks or until further direction given by your physician.  You cannot drive while taking narcotics.  o No lifting or carrying greater than 10 lbs. until further directed by your surgeon. o Avoid periods of inactivity such as sitting longer than an hour when not asleep. This helps prevent blood clots.  o You may return to work once you are authorized by your doctor.     WEIGHT  BEARING   Weight bearing as tolerated with assist device (walker, cane, etc) as directed, use it as long as suggested by your surgeon or therapist, typically at least 4-6 weeks.   EXERCISES  Results after joint replacement surgery are often greatly improved when you follow the exercise, range of motion and muscle strengthening exercises prescribed by your doctor. Safety measures are also important to protect the joint from further injury. Any time any of these exercises cause you to have increased pain or swelling, decrease what you are doing until you are comfortable again and then slowly increase them. If you have problems or questions, call your caregiver or physical therapist for advice.   Rehabilitation is important following a joint replacement. After just a few days of immobilization, the muscles of the leg can become weakened and shrink (atrophy).  These exercises are designed to build up the tone and strength of the thigh and leg muscles and to improve motion. Often times heat used for twenty to thirty minutes before working out will loosen up your tissues and help with improving the range of motion but do not use heat for the first two weeks following surgery (sometimes heat can increase post-operative swelling).   These exercises can be done on a training (exercise) mat, on the floor, on a table or on a bed. Use whatever works the best and is most comfortable for you.    Use music or television while  you are exercising so that the exercises are a pleasant break in your day. This will make your life better with the exercises acting as a break in your routine that you can look forward to.   Perform all exercises about fifteen times, three times per day or as directed.  You should exercise both the operative leg and the other leg as well. ° °Exercises include: °  °• Quad Sets - Tighten up the muscle on the front of the thigh (Quad) and hold for 5-10 seconds.   °• Straight Leg Raises - With your  knee straight (if you were given a brace, keep it on), lift the leg to 60 degrees, hold for 3 seconds, and slowly lower the leg.  Perform this exercise against resistance later as your leg gets stronger.  °• Leg Slides: Lying on your back, slowly slide your foot toward your buttocks, bending your knee up off the floor (only go as far as is comfortable). Then slowly slide your foot back down until your leg is flat on the floor again.  °• Angel Wings: Lying on your back spread your legs to the side as far apart as you can without causing discomfort.  °• Hamstring Strength:  Lying on your back, push your heel against the floor with your leg straight by tightening up the muscles of your buttocks.  Repeat, but this time bend your knee to a comfortable angle, and push your heel against the floor.  You may put a pillow under the heel to make it more comfortable if necessary.  ° °A rehabilitation program following joint replacement surgery can speed recovery and prevent re-injury in the future due to weakened muscles. Contact your doctor or a physical therapist for more information on knee rehabilitation.  ° ° °CONSTIPATION ° °Constipation is defined medically as fewer than three stools per week and severe constipation as less than one stool per week.  Even if you have a regular bowel pattern at home, your normal regimen is likely to be disrupted due to multiple reasons following surgery.  Combination of anesthesia, postoperative narcotics, change in appetite and fluid intake all can affect your bowels.  ° °YOU MUST use at least one of the following options; they are listed in order of increasing strength to get the job done.  They are all available over the counter, and you may need to use some, POSSIBLY even all of these options:   ° °Drink plenty of fluids (prune juice may be helpful) and high fiber foods °Colace 100 mg by mouth twice a day  °Senokot for constipation as directed and as needed Dulcolax (bisacodyl), take  with full glass of water  °Miralax (polyethylene glycol) once or twice a day as needed. ° °If you have tried all these things and are unable to have a bowel movement in the first 3-4 days after surgery call either your surgeon or your primary doctor.   ° °If you experience loose stools or diarrhea, hold the medications until you stool forms back up.  If your symptoms do not get better within 1 week or if they get worse, check with your doctor.  If you experience "the worst abdominal pain ever" or develop nausea or vomiting, please contact the office immediately for further recommendations for treatment. ° ° °ITCHING:  If you experience itching with your medications, try taking only a single pain pill, or even half a pain pill at a time.  You can also use Benadryl over the counter   for itching or also to help with sleep.   TED HOSE STOCKINGS:  Use stockings on both legs until for at least 2 weeks or as directed by physician office. They may be removed at night for sleeping.  MEDICATIONS:  See your medication summary on the After Visit Summary that nursing will review with you.  You may have some home medications which will be placed on hold until you complete the course of blood thinner medication.  It is important for you to complete the blood thinner medication as prescribed.  PRECAUTIONS:  If you experience chest pain or shortness of breath - call 911 immediately for transfer to the hospital emergency department.   If you develop a fever greater that 101 F, purulent drainage from wound, increased redness or drainage from wound, foul odor from the wound/dressing, or calf pain - CONTACT YOUR SURGEON.                                                   FOLLOW-UP APPOINTMENTS:  If you do not already have a post-op appointment, please call the office for an appointment to be seen by your surgeon.  Guidelines for how soon to be seen are listed in your After Visit Summary, but are typically between 1-4 weeks  after surgery.  OTHER INSTRUCTIONS:   Knee Replacement:  Do not place pillow under knee, focus on keeping the knee straight while resting. CPM instructions: 0-90 degrees, 2 hours in the morning, 2 hours in the afternoon, and 2 hours in the evening. Place foam block, curve side up under heel at all times except when in CPM or when walking.  DO NOT modify, tear, cut, or change the foam block in any way.  MAKE SURE YOU:   Understand these instructions.   Get help right away if you are not doing well or get worse.    Thank you for letting us be a part of your medical care team.  It is a privilege we respect greatly.  We hope these instructions will help you stay on track for a fast and full recovery!   Information on my medicine - XARELTO (Rivaroxaban)  This medication education was reviewed with me or my healthcare representative as part of my discharge preparation.  The pharmacist that spoke with me during my hospital stay was:  Tomasita Morrow, [facility]  Why was Xarelto prescribed for you? Xarelto was prescribed for you to reduce the risk of blood clots forming after orthopedic surgery. The medical term for these abnormal blood clots is venous thromboembolism (VTE).  What do you need to know about xarelto ? Take your Xarelto ONCE DAILY at the same time every day. You may take it either with or without food.  If you have difficulty swallowing the tablet whole, you may crush it and mix in applesauce just prior to taking your dose.  Take Xarelto exactly as prescribed by your doctor and DO NOT stop taking Xarelto without talking to the doctor who prescribed the medication.  Stopping without other VTE prevention medication to take the place of Xarelto may increase your risk of developing a clot.  After discharge, you should have regular check-up appointments with your healthcare provider that is prescribing your Xarelto.    What do you do if you miss a dose? If you miss a dose,  take it as soon as you remember on the same day then continue your regularly scheduled once daily regimen the next day. Do not take two doses of Xarelto on the same day.   Important Safety Information A possible side effect of Xarelto is bleeding. You should call your healthcare provider right away if you experience any of the following: ? Bleeding from an injury or your nose that does not stop. ? Unusual colored urine (red or dark brown) or unusual colored stools (red or black). ? Unusual bruising for unknown reasons. ? A serious fall or if you hit your head (even if there is no bleeding).  Some medicines may interact with Xarelto and might increase your risk of bleeding while on Xarelto. To help avoid this, consult your healthcare provider or pharmacist prior to using any new prescription or non-prescription medications, including herbals, vitamins, non-steroidal anti-inflammatory drugs (NSAIDs) and supplements.  This website has more information on Xarelto: [email or web address]

## 2017-03-04 NOTE — Progress Notes (Signed)
Physical Therapy Treatment Patient Details Name: [name] MRN: [id number] DOB: [birth year] Today's Date: 03/04/2017    History of Present Illness Pt is a 61 yo male with c/o L hip pain secondary to end stage hip OA, s/p L THA anterion approach 03/03/17. PMH of OA, and CA.     PT Comments    Pt is making good progress towards his goals. Pt currently supervision for transfers and ambulation of 600 feet with RW and min guard for ascend/descend of 14 steps. Pt is safe for discharge home but requires skilled PT to continue to progress gait training and to improve LE strength and endurance to safely mobilize in his environment.    Follow Up Recommendations  Outpatient PT;Supervision/Assistance - 24 hour     Equipment Recommendations  None recommended by PT    Recommendations for Other Services       Precautions / Restrictions Precautions Precautions: Anterior Hip Precaution Booklet Issued: Yes (comment) Precaution Comments: booklet issued Restrictions Weight Bearing Restrictions: No LLE Weight Bearing: Weight bearing as tolerated    Mobility  Bed Mobility               General bed mobility comments: in recliner at entry  Transfers Overall transfer level: Needs assistance Equipment used: Rolling walker (2 wheeled) Transfers: Sit to/from Stand Sit to Stand: Supervision         General transfer comment: supervision for safety, good hand placement for push off and descent into recliner  Ambulation/Gait Ambulation/Gait assistance: Supervision Ambulation Distance (Feet): 600 Feet Assistive device: Rolling walker (2 wheeled) Gait Pattern/deviations: Step-through pattern;Step-to pattern;Narrow base of support Gait velocity: slowed Gait velocity interpretation: Below normal speed for age/gender General Gait Details: supervision for safety ambulated with slow steady cadence, no obvious deviation   Stairs Stairs: Yes   Stair Management: One rail Left;Step to  pattern;Forwards Number of Stairs: 14 General stair comments: safe ascend/descend vc for sequencing    Balance Overall balance assessment: Modified Independent                                          Cognition Arousal/Alertness: Awake/alert Behavior During Therapy: WFL for tasks assessed/performed Overall Cognitive Status: Within Functional Limits for tasks assessed                                               Pertinent Vitals/Pain Pain Assessment: 0-10 Pain Score: 1  Pain Location: L hip surgical site Pain Descriptors / Indicators: Constant;Aching Pain Intervention(s): Monitored during session  VSS           PT Goals (current goals can now be found in the care plan section) Acute Rehab PT Goals Patient Stated Goal: go home PT Goal Formulation: With patient Time For Goal Achievement: 03/18/17 Potential to Achieve Goals: Good Progress towards PT goals: Progressing toward goals    Frequency    7X/week      PT Plan Current plan remains appropriate       AM-PAC PT "6 Clicks" Daily Activity  Outcome Measure  Difficulty turning over in bed (including adjusting bedclothes, sheets and blankets)?: A Little Difficulty moving from lying on back to sitting on the side of the bed? : A Little Difficulty sitting down on and standing up from  a chair with arms (e.g., wheelchair, bedside commode, etc,.)?: A Little Help needed moving to and from a bed to chair (including a wheelchair)?: None Help needed walking in hospital room?: None Help needed climbing 3-5 steps with a railing? : None 6 Click Score: 21    End of Session Equipment Utilized During Treatment: Gait belt Activity Tolerance: Patient tolerated treatment well Patient left: in chair;with call bell/phone within reach;with family/visitor present Nurse Communication: Mobility status;Precautions;Weight bearing status;Other (comment) (desire for outpatient PT at discharge) PT  Visit Diagnosis: Pain;Other abnormalities of gait and mobility (R26.89) Pain - Right/Left: Left Pain - part of body: Hip     Time: 7858-8502 PT Time Calculation (min) (ACUTE ONLY): 18 min  Charges:  $Gait Training: 8-22 mins                    G Codes:       Aftan Vint B. Migdalia Dk PT, DPT Acute Rehabilitation  [phone] Pager [phone]     [place] 03/04/2017, 1:21 PM

## 2017-03-04 NOTE — Discharge Summary (Signed)
Physician Discharge Summary      Patient ID: [name] MRN: [id number] DOB/AGE: [birth year] 61 y.o.  Admit date: 03/03/2017 Discharge date: 03/04/2017  Admission Diagnoses:  <principal problem not specified>  Discharge Diagnoses:  Active Problems:   History of total hip replacement   Past Medical History:  Diagnosis Date  . Arthritis   . Blood dyscrasia    multiples myloma  . 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 Dental crowns present    loose upper front crown  . GERD (gastroesophageal reflux disease)   . High cholesterol   . History of hiatal hernia   . History of kidney stones   . Numbness of foot    right toe    Surgeries: Procedure(s): LEFT TOTAL HIP ARTHROPLASTY ANTERIOR APPROACH on 03/03/2017   Consultants (if any):   Discharged Condition: Improved  Hospital Course: ZACKAREY HOLLEMAN is an 61 y.o. male who was admitted 03/03/2017 with a diagnosis of <principal problem not specified> and went to the operating room on 03/03/2017 and underwent the above named procedures.    He was given perioperative antibiotics:  Anti-infectives    Start     Dose/Rate Route Frequency Ordered Stop   03/03/17 1830  ceFAZolin (ANCEF) IVPB 2g/100 mL premix     2 g 200 mL/hr over 30 Minutes Intravenous Every 6 hours 03/03/17 1744 03/04/17 0002   03/03/17 1013  ceFAZolin (ANCEF) IVPB 2g/100 mL premix     2 g 200 mL/hr over 30 Minutes Intravenous On call to O.R. 03/03/17 1013 03/03/17 1225   03/03/17 0000  sulfamethoxazole-trimethoprim (BACTRIM DS,SEPTRA DS) 800-160 MG tablet     1 tablet Oral 2 times daily 03/03/17 1358      .  He was given sequential compression devices, early ambulation, and xarelto for DVT prophylaxis.  He benefited maximally from the hospital stay and there were no complications.    Recent vital signs:  Vitals:   03/04/17 0405 03/04/17 1300  BP: (!) 113/55 (!) 125/56  Pulse: 62 73  Resp: 20 20  Temp: 98.1 F (36.7 C) 98 F (36.7 C)    Recent  laboratory studies:  Lab Results  Component Value Date   HGB 12.4 (L) 03/04/2017   HGB 14.0 02/25/2017   HGB 15.4 12/01/2012   Lab Results  Component Value Date   WBC 4.9 03/04/2017   PLT 128 (L) 03/04/2017   Lab Results  Component Value Date   INR 0.99 02/25/2017   Lab Results  Component Value Date   NA 134 (L) 03/04/2017   K 3.5 03/04/2017   CL 101 03/04/2017   CO2 28 03/04/2017   BUN 9 03/04/2017   CREATININE 0.92 03/04/2017   GLUCOSE 118 (H) 03/04/2017    Discharge Medications:   Allergies as of 03/04/2017      Reactions   Niacin Other (See Comments)   Flushing, insomnia   Multihance [gadobenate] Hives   Zocor [simvastatin] Other (See Comments)   Muscle cramping      Medication List    TAKE these medications   aspirin EC 81 MG tablet Take 81 mg by mouth daily.   atorvastatin 10 MG tablet Commonly known as:  LIPITOR Take 10 mg by mouth every other day. In the morning.   ibuprofen 200 MG tablet Commonly known as:  ADVIL,MOTRIN Take 200-400 mg by mouth every 6 (six) hours as needed (for pain.).   methocarbamol 750 MG tablet Commonly known as:  ROBAXIN Take 1 tablet (  750 mg total) by mouth 2 (two) times daily as needed for muscle spasms.   ondansetron 4 MG tablet Commonly known as:  ZOFRAN Take 1-2 tablets (4-8 mg total) by mouth every 8 (eight) hours as needed for nausea or vomiting.   oxyCODONE 5 MG immediate release tablet Commonly known as:  Oxy IR/ROXICODONE Take 1-3 tablets (5-15 mg total) by mouth every 4 (four) hours as needed.   oxyCODONE 10 mg 12 hr tablet Commonly known as:  OXYCONTIN Take 1 tablet (10 mg total) by mouth every 12 (twelve) hours.   ranitidine 150 MG tablet Commonly known as:  ZANTAC Take 150 mg by mouth daily.   rivaroxaban 10 MG Tabs tablet Commonly known as:  XARELTO Take 1 tablet (10 mg total) by mouth daily.   senna-docusate 8.6-50 MG tablet Commonly known as:  SENOKOT S Take 1 tablet by mouth at bedtime as  needed.   sulfamethoxazole-trimethoprim 800-160 MG tablet Commonly known as:  BACTRIM DS,SEPTRA DS Take 1 tablet by mouth 2 (two) times daily.   VITAMIN B COMPLEX PO Take 1 tablet by mouth daily.   Vitamin D3 2000 units Tabs Take 2,000 Units by mouth daily.            Durable Medical Equipment        Start     Ordered   03/03/17 1745  DME Walker rolling  Once    Question:  Patient needs a walker to treat with the following condition  Answer:  History of hip replacement   03/03/17 1744   03/03/17 1745  DME 3 n 1  Once     03/03/17 1744   03/03/17 1745  DME Bedside commode  Once    Question:  Patient needs a bedside commode to treat with the following condition  Answer:  History of hip replacement   03/03/17 1744      Diagnostic Studies: Dg Pelvis Portable  Result Date: 03/03/2017 CLINICAL DATA:  Postop day 0 left total hip arthroplasty. EXAM: PORTABLE PELVIS 1-2 VIEWS 4:28 p.m.: COMPARISON:  Intraoperative left hip x-rays earlier today 1:38 p.m. FINDINGS: Anatomic alignment in the AP projection of the left total hip arthroplasty. No acute complicating features. Mild to moderate narrowing of the joint space in the contralateral right hip. Prior lower lumbar fusion. Sacroiliac joints and symphysis pubis intact. IMPRESSION: Anatomic alignment in the AP projection post left total hip arthroplasty without acute complicating features. Electronically Signed   By: Evangeline Dakin M.D.   On: 03/03/2017 18:58   Dg C-arm 1-60 Min  Result Date: 03/03/2017 CLINICAL DATA:  Status post anterior approach left total hip arthroplasty. EXAM: OPERATIVE left HIP (WITH PELVIS IF PERFORMED) 2 VIEWS TECHNIQUE: Fluoroscopic spot image(s) were submitted for interpretation post-operatively. COMPARISON:  None in PACs FINDINGS: The reported fluoro time is 43 seconds. Two fluoro spot images are reviewed. The images are labeled right which is apparently erroneous. According to the patient's recovery room  nurse, Suanne Marker, the patient had a left hip joint prosthesis placed. The positioning of the prosthetic left hip appears normal. The interface with the native bone appears normal. No acute native bone fracture is observed. IMPRESSION: No postprocedure complication following left total hip joint prosthesis placement. Please note that the images are labeled right which is erroneous. Electronically Signed   By: David  Martinique M.D.   On: 03/03/2017 17:03   Dg Hip Operative Unilat W Or W/o Pelvis Left  Result Date: 03/03/2017 CLINICAL DATA:  Status post anterior approach left  total hip arthroplasty. EXAM: OPERATIVE left HIP (WITH PELVIS IF PERFORMED) 2 VIEWS TECHNIQUE: Fluoroscopic spot image(s) were submitted for interpretation post-operatively. COMPARISON:  None in PACs FINDINGS: The reported fluoro time is 43 seconds. Two fluoro spot images are reviewed. The images are labeled right which is apparently erroneous. According to the patient's recovery room nurse, Suanne Marker, the patient had a left hip joint prosthesis placed. The positioning of the prosthetic left hip appears normal. The interface with the native bone appears normal. No acute native bone fracture is observed. IMPRESSION: No postprocedure complication following left total hip joint prosthesis placement. Please note that the images are labeled right which is erroneous. Electronically Signed   By: David  Martinique M.D.   On: 03/03/2017 17:03   Xr Hip Unilat W Or W/o Pelvis 2-3 Views Left  Result Date: 02/03/2017 Degenerative joint disease left hip.  No obvious signs of lesions within the acetabulum.   Disposition: 01-Home or Self Care  Discharge Instructions    Call MD / Call 911    Complete by:  As directed    If you experience chest pain or shortness of breath, CALL 911 and be transported to the hospital emergency room.  If you develope a fever above 101.5 F, pus (white drainage) or increased drainage or redness at the wound, or calf pain, call your  surgeon's office.   Constipation Prevention    Complete by:  As directed    Drink plenty of fluids.  Prune juice may be helpful.  You may use a stool softener, such as Colace (over the counter) 100 mg twice a day.  Use MiraLax (over the counter) for constipation as needed.   Driving restrictions    Complete by:  As directed    No driving while taking narcotic pain meds.   Increase activity slowly as tolerated    Complete by:  As directed       Follow-up Information    Leandrew Koyanagi, MD Follow up in 2 week(s).   Specialty:  Orthopedic Surgery Why:  For wound re-check, For suture removal Contact information: [place] [id number] [phone]        [facility] Follow up.   Why:  Your appointment to start physical therapy is on Monday, March 07, 2017 at 1:00pm Contact information: [address] [place], [place] [phone]           Signed: Eduard Roux 03/04/2017, 3:10 PM

## 2017-03-17 ENCOUNTER — Inpatient Hospital Stay (INDEPENDENT_AMBULATORY_CARE_PROVIDER_SITE_OTHER): Payer: BLUE CROSS/BLUE SHIELD | Admitting: Orthopaedic Surgery

## 2017-03-21 ENCOUNTER — Ambulatory Visit (INDEPENDENT_AMBULATORY_CARE_PROVIDER_SITE_OTHER): Payer: BLUE CROSS/BLUE SHIELD | Admitting: Orthopaedic Surgery

## 2017-03-21 ENCOUNTER — Encounter (INDEPENDENT_AMBULATORY_CARE_PROVIDER_SITE_OTHER): Payer: Self-pay | Admitting: Orthopaedic Surgery

## 2017-03-21 DIAGNOSIS — M1612 Unilateral primary osteoarthritis, left hip: Secondary | ICD-10-CM

## 2017-03-21 NOTE — Progress Notes (Signed)
Patient is 2 weeks status post left total hip replacement. He is doing well. He is using a cane occasionally. He is progressing with home health physical therapy. The incision is healed. At this point continue with physical therapy. He has finished is rolled toe now I want him to take aspirin 325 twice a day for 4 more weeks. May drive and about 4 weeks. Questions encouraged and answered. Follow-up in 4 weeks with standing AP pelvis and lateral left hip x-ray.

## 2017-04-05 ENCOUNTER — Other Ambulatory Visit (INDEPENDENT_AMBULATORY_CARE_PROVIDER_SITE_OTHER): Payer: Self-pay | Admitting: Orthopaedic Surgery

## 2017-04-05 NOTE — Telephone Encounter (Signed)
reifll

## 2017-04-05 NOTE — Telephone Encounter (Signed)
Yes #30. 2 refills

## 2017-04-18 ENCOUNTER — Ambulatory Visit (INDEPENDENT_AMBULATORY_CARE_PROVIDER_SITE_OTHER): Payer: BLUE CROSS/BLUE SHIELD

## 2017-04-18 ENCOUNTER — Ambulatory Visit (INDEPENDENT_AMBULATORY_CARE_PROVIDER_SITE_OTHER): Payer: BLUE CROSS/BLUE SHIELD | Admitting: Orthopaedic Surgery

## 2017-04-18 DIAGNOSIS — M1612 Unilateral primary osteoarthritis, left hip: Secondary | ICD-10-CM

## 2017-04-18 NOTE — Progress Notes (Signed)
[name] is 6 weeks status post left total hip replacement. He is doing well. He has finished home health physical therapy. His surgical scar is fully healed. His leg lengths are equal. He doing home exercises. Occasionally has some numbness or pain radiating down his thigh. Overall he is happy. He is not not walking with a limp. X-ray show stable alignment. At this point I'll see him back in 6 weeks for recheck.

## 2017-05-09 ENCOUNTER — Other Ambulatory Visit (INDEPENDENT_AMBULATORY_CARE_PROVIDER_SITE_OTHER): Payer: Self-pay | Admitting: Orthopaedic Surgery

## 2017-05-30 ENCOUNTER — Encounter (INDEPENDENT_AMBULATORY_CARE_PROVIDER_SITE_OTHER): Payer: Self-pay | Admitting: Orthopaedic Surgery

## 2017-05-30 ENCOUNTER — Ambulatory Visit (INDEPENDENT_AMBULATORY_CARE_PROVIDER_SITE_OTHER): Payer: BLUE CROSS/BLUE SHIELD | Admitting: Orthopaedic Surgery

## 2017-05-30 DIAGNOSIS — M1612 Unilateral primary osteoarthritis, left hip: Secondary | ICD-10-CM

## 2017-05-30 NOTE — Progress Notes (Signed)
Patient is 3 months status post left total hip replacement. He don't well. He is doing home exercises. He has occasional discomfort when she takes ibuprofen for. Overall he is very happy. He is walking without any assistive devices. Dental prophylaxis was enforced. Leg lengths are equal. Painless range of motion of the hip. Follow-up in 3 months with repeat AP pelvis and lateral hip. Questions encouraged and answered.

## 2017-06-02 ENCOUNTER — Other Ambulatory Visit (INDEPENDENT_AMBULATORY_CARE_PROVIDER_SITE_OTHER): Payer: Self-pay | Admitting: Orthopaedic Surgery

## 2017-06-02 MED ORDER — AMOXICILLIN 500 MG PO CAPS: 2000.0000 mg | ORAL_CAPSULE | Freq: Once | ORAL | 0 refills | Status: AC

## 2017-08-29 ENCOUNTER — Ambulatory Visit (INDEPENDENT_AMBULATORY_CARE_PROVIDER_SITE_OTHER): Payer: BLUE CROSS/BLUE SHIELD | Admitting: Orthopaedic Surgery

## 2017-08-29 ENCOUNTER — Ambulatory Visit (INDEPENDENT_AMBULATORY_CARE_PROVIDER_SITE_OTHER): Payer: BLUE CROSS/BLUE SHIELD

## 2017-08-29 ENCOUNTER — Encounter (INDEPENDENT_AMBULATORY_CARE_PROVIDER_SITE_OTHER): Payer: Self-pay | Admitting: Orthopaedic Surgery

## 2017-08-29 DIAGNOSIS — M25552 Pain in left hip: Secondary | ICD-10-CM

## 2017-08-29 DIAGNOSIS — M1612 Unilateral primary osteoarthritis, left hip: Secondary | ICD-10-CM | POA: Diagnosis not present

## 2017-08-29 NOTE — Progress Notes (Signed)
Office Visit Note   Patient: [name]           Date of Birth: December 28, 1955           MRN: [id number] Visit Date: 08/29/2017              Requested by: Moshe Cipro, MD [address] [place], [place] [address] PCP: Moshe Cipro, MD   Assessment & Plan: Visit Diagnoses:  1. Pain in left hip   2. Unilateral primary osteoarthritis, left hip     Plan: Patient is 6 months status post left total hip replacement doing very well.  Dental prophylaxis was addressed.  Questions encouraged and answered.  Follow-up in 6 months with standing AP and lateral left hip  Follow-Up Instructions: Return in about 6 months (around 02/26/2018).   Orders:  Orders Placed This Encounter  Procedures  . XR HIP UNILAT W OR W/O PELVIS 2-3 VIEWS LEFT   No orders of the defined types were placed in this encounter.     Procedures: No procedures performed   Clinical Data: No additional findings.   Subjective: Chief Complaint  Patient presents with  . Left Hip - Pain, Follow-up    Patient is 6 months status post left total hip replacement.  He is doing well.  He does not have any complaints.    Review of Systems   Objective: Vital Signs: There were no vitals taken for this visit.  Physical Exam  Ortho Exam Left hip exam shows a fully healed surgical scar.  Leg lengths are equal.  Painless rotation of the hip. Specialty Comments:  No specialty comments available.  Imaging: Xr Hip Unilat W Or W/o Pelvis 2-3 Views Left  Result Date: 08/29/2017 Stable total hip replacement    PMFS History: Patient Active Problem List   Diagnosis Date Noted  . History of total hip replacement 03/03/2017  . Unilateral primary osteoarthritis, left hip 09/18/2016   Past Medical History:  Diagnosis Date  . Arthritis   . Blood dyscrasia    multiples myloma  . 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 Dental crowns present    loose upper front crown  . GERD (gastroesophageal reflux  disease)   . High cholesterol   . History of hiatal hernia   . History of kidney stones   . Numbness of foot    right toe    Family History  Problem Relation Age of Onset  . Colon cancer Father 72    Past Surgical History:  Procedure Laterality Date  . ANTERIOR CRUCIATE LIGAMENT REPAIR Right   . BACK SURGERY     fusion  . CHOLECYSTECTOMY  1997  . COLONOSCOPY    . EXTRACORPOREAL SHOCK WAVE LITHOTRIPSY Right 04/10/2014   Procedure: EXTRACORPOREAL SHOCK WAVE LITHOTRIPSY (ESWL);  Surgeon: Harle Stanford, MD;  Location: [facility];  Service: Urology;  Laterality: Right;  . LUMBAR LAMINECTOMY  2008  . ROTATOR CUFF REPAIR Bilateral 2001,2007  . TOTAL HIP ARTHROPLASTY Left 03/03/2017   Procedure: LEFT TOTAL HIP ARTHROPLASTY ANTERIOR APPROACH;  Surgeon: Leandrew Koyanagi, MD;  Location: [facility];  Service: Orthopedics;  Laterality: Left;   Social History   Occupational History  . Not on file  Tobacco Use  . Smoking status: Former Smoker    Packs/day: 1.00    Years: 15.00    Pack years: 15.00    Types: Cigarettes  . Smokeless tobacco: Former Systems developer    Quit date: 04/10/1989  Substance and  Sexual Activity  . Alcohol use: Yes    Comment: socially  . Drug use: No  . Sexual activity: Yes

## 2017-08-30 ENCOUNTER — Ambulatory Visit (INDEPENDENT_AMBULATORY_CARE_PROVIDER_SITE_OTHER): Payer: BLUE CROSS/BLUE SHIELD | Admitting: Orthopaedic Surgery

## 2017-09-19 ENCOUNTER — Ambulatory Visit (INDEPENDENT_AMBULATORY_CARE_PROVIDER_SITE_OTHER): Payer: BLUE CROSS/BLUE SHIELD

## 2017-09-19 ENCOUNTER — Ambulatory Visit (INDEPENDENT_AMBULATORY_CARE_PROVIDER_SITE_OTHER): Payer: BLUE CROSS/BLUE SHIELD | Admitting: Orthopaedic Surgery

## 2017-09-19 ENCOUNTER — Encounter (INDEPENDENT_AMBULATORY_CARE_PROVIDER_SITE_OTHER): Payer: Self-pay | Admitting: Orthopaedic Surgery

## 2017-09-19 DIAGNOSIS — M25551 Pain in right hip: Secondary | ICD-10-CM | POA: Diagnosis not present

## 2017-09-19 MED ORDER — PREDNISONE 10 MG (21) PO TBPK: ORAL_TABLET | ORAL | 0 refills | Status: DC

## 2017-09-19 NOTE — Progress Notes (Signed)
Office Visit Note   Patient: [name]           Date of Birth: [birth year]           MRN: [id number] Visit Date: 09/19/2017              Requested by: Moshe Cipro, MD [address] [place],  [address] PCP: Moshe Cipro, MD   Assessment & Plan: Visit Diagnoses:  1. Pain of right hip joint     Plan: Impression is mild right hip degenerative joint disease.  Prednisone taper prescribed today.  Overall functionally speaking he is very well compensated.  He will let us know if the prednisone taper does not help significantly and we would think about a cortisone injection in the right hip.  Follow-up as needed.  Follow-Up Instructions: Return if symptoms worsen or fail to improve.   Orders:  Orders Placed This Encounter  Procedures  . XR HIP UNILAT W OR W/O PELVIS 2-3 VIEWS RIGHT   Meds ordered this encounter  Medications  . predniSONE (STERAPRED UNI-PAK 21 TAB) 10 MG (21) TBPK tablet    Sig: Take as directed    Dispense:  21 tablet    Refill:  0      Procedures: No procedures performed   Clinical Data: No additional findings.   Subjective: Chief Complaint  Patient presents with  . Right Hip - Pain    Gentleman whose left hip I replaced who comes in with a new problem of right hip pain.  He endorses groin pain that wraps around to the buttock area.  The pain is worse with activity and standing and better with rest.  Denies any numbness and tingling or radiation or radicular symptoms.    Review of Systems  Constitutional: Negative.   All other systems reviewed and are negative.    Objective: Vital Signs: There were no vitals taken for this visit.  Physical Exam  Constitutional: He is oriented to person, place, and time. He appears well-developed and well-nourished.  Pulmonary/Chest: Effort normal.  Abdominal: Soft.  Neurological: He is alert and oriented to person, place, and time.  Skin: Skin is warm.  Psychiatric: He has a normal  mood and affect. His behavior is normal. Judgment and thought content normal.  Nursing note and vitals reviewed.   Ortho Exam Right hip exam shows mild discomfort with rotation.  Negative Stinchfield sign.  Lateral hip is nontender. Specialty Comments:  No specialty comments available.  Imaging: Xr Hip Unilat W Or W/o Pelvis 2-3 Views Right  Result Date: 09/19/2017 Mild degenerative joint disease right hip.  Stable left total hip replacement    PMFS History: Patient Active Problem List   Diagnosis Date Noted  . History of total hip replacement 03/03/2017  . Unilateral primary osteoarthritis, left hip 09/18/2016   Past Medical History:  Diagnosis Date  . Arthritis   . Blood dyscrasia    multiples myloma  . 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 Dental crowns present    loose upper front crown  . GERD (gastroesophageal reflux disease)   . High cholesterol   . History of hiatal hernia   . History of kidney stones   . Numbness of foot    right toe    Family History  Problem Relation Age of Onset  . Colon cancer Father 24    Past Surgical History:  Procedure Laterality Date  . ANTERIOR CRUCIATE LIGAMENT REPAIR Right   .  BACK SURGERY     fusion  . CHOLECYSTECTOMY  1997  . COLONOSCOPY    . EXTRACORPOREAL SHOCK WAVE LITHOTRIPSY Right 04/10/2014   Procedure: EXTRACORPOREAL SHOCK WAVE LITHOTRIPSY (ESWL);  Surgeon: Harle Stanford, MD;  Location: [facility];  Service: Urology;  Laterality: Right;  . LUMBAR LAMINECTOMY  2008  . ROTATOR CUFF REPAIR Bilateral 2001,2007  . TOTAL HIP ARTHROPLASTY Left 03/03/2017   Procedure: LEFT TOTAL HIP ARTHROPLASTY ANTERIOR APPROACH;  Surgeon: Leandrew Koyanagi, MD;  Location: [facility];  Service: Orthopedics;  Laterality: Left;   Social History   Occupational History  . Not on file  Tobacco Use  . Smoking status: Former Smoker    Packs/day: 1.00    Years: 15.00    Pack years: 15.00    Types: Cigarettes  . Smokeless tobacco: Former Systems developer    Quit  date: 04/10/1989  Substance and Sexual Activity  . Alcohol use: Yes    Comment: socially  . Drug use: No  . Sexual activity: Yes

## 2017-10-04 HISTORY — PX: BONE MARROW TRANSPLANT: SHX200

## 2017-10-21 ENCOUNTER — Ambulatory Visit (INDEPENDENT_AMBULATORY_CARE_PROVIDER_SITE_OTHER): Payer: BLUE CROSS/BLUE SHIELD | Admitting: Orthopedic Surgery

## 2017-11-16 ENCOUNTER — Other Ambulatory Visit (INDEPENDENT_AMBULATORY_CARE_PROVIDER_SITE_OTHER): Payer: Self-pay | Admitting: Orthopaedic Surgery

## 2017-11-16 MED ORDER — AMOXICILLIN 500 MG PO CAPS: 2000.0000 mg | ORAL_CAPSULE | Freq: Once | ORAL | 6 refills | Status: AC

## 2018-02-17 ENCOUNTER — Ambulatory Visit (INDEPENDENT_AMBULATORY_CARE_PROVIDER_SITE_OTHER): Payer: BLUE CROSS/BLUE SHIELD | Admitting: Orthopaedic Surgery

## 2018-02-17 ENCOUNTER — Ambulatory Visit (INDEPENDENT_AMBULATORY_CARE_PROVIDER_SITE_OTHER): Payer: BLUE CROSS/BLUE SHIELD

## 2018-02-17 ENCOUNTER — Encounter (INDEPENDENT_AMBULATORY_CARE_PROVIDER_SITE_OTHER): Payer: Self-pay | Admitting: Orthopaedic Surgery

## 2018-02-17 DIAGNOSIS — M1612 Unilateral primary osteoarthritis, left hip: Secondary | ICD-10-CM | POA: Diagnosis not present

## 2018-02-17 NOTE — Progress Notes (Signed)
Office Visit Note   Patient: [name]           Date of Birth: December 16, 1955           MRN: [id number] Visit Date: 02/17/2018              Requested by: Moshe Cipro, MD [address] [place], [place] [address] PCP: Moshe Cipro, MD   Assessment & Plan: Visit Diagnoses:  1. Unilateral primary osteoarthritis, left hip     Plan: 1 year THA follow up plan  Patient is now one year out from a total hip arthroplasty. Patient is doing very well and very pleased with the results. Radiographs reveal a total hip arthroplasty in good position, with no evidence of subsidence, loosening, or complicating features. It was reinforced that prophylactic antibiotics should be taken with any procedure including but not limited to dental work or colonoscopies. We plan to follow them at 1 year intervals at this time with radiographs at each visit, and as always we should be notified with any questions or concerns in the interim.  Follow-Up Instructions: Return in about 1 year (around 02/18/2019).   Orders:  Orders Placed This Encounter  Procedures  . XR HIP UNILAT W OR W/O PELVIS 2-3 VIEWS LEFT   No orders of the defined types were placed in this encounter.     Procedures: No procedures performed   Clinical Data: No additional findings.   Subjective: Chief Complaint  Patient presents with  . Left Hip - Pain    Patient is one-year status post left total hip replacement and doing well.  He has no complaints.   Review of Systems   Objective: Vital Signs: There were no vitals taken for this visit.  Physical Exam  Ortho Exam Surgical scar is fully healed.  Leg lengths are equal.  Full range of motion of the hip. Specialty Comments:  No specialty comments available.  Imaging: Xr Hip Unilat W Or W/o Pelvis 2-3 Views Left  Result Date: 02/17/2018 Stable left total hip replacement without complication    PMFS History: Patient Active Problem List   Diagnosis Date  Noted  . History of total hip replacement 03/03/2017  . Unilateral primary osteoarthritis, left hip 09/18/2016   Past Medical History:  Diagnosis Date  . Arthritis   . Blood dyscrasia    multiples myloma  . 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 Dental crowns present    loose upper front crown  . GERD (gastroesophageal reflux disease)   . High cholesterol   . History of hiatal hernia   . History of kidney stones   . Numbness of foot    right toe    Family History  Problem Relation Age of Onset  . Colon cancer Father 2    Past Surgical History:  Procedure Laterality Date  . ANTERIOR CRUCIATE LIGAMENT REPAIR Right   . BACK SURGERY     fusion  . CHOLECYSTECTOMY  1997  . COLONOSCOPY    . EXTRACORPOREAL SHOCK WAVE LITHOTRIPSY Right 04/10/2014   Procedure: EXTRACORPOREAL SHOCK WAVE LITHOTRIPSY (ESWL);  Surgeon: Harle Stanford, MD;  Location: [facility];  Service: Urology;  Laterality: Right;  . LUMBAR LAMINECTOMY  2008  . ROTATOR CUFF REPAIR Bilateral 2001,2007  . TOTAL HIP ARTHROPLASTY Left 03/03/2017   Procedure: LEFT TOTAL HIP ARTHROPLASTY ANTERIOR APPROACH;  Surgeon: Leandrew Koyanagi, MD;  Location: [facility];  Service: Orthopedics;  Laterality: Left;   Social History  Occupational History  . Not on file  Tobacco Use  . Smoking status: Former Smoker    Packs/day: 1.00    Years: 15.00    Pack years: 15.00    Types: Cigarettes  . Smokeless tobacco: Former Systems developer    Quit date: 04/10/1989  Substance and Sexual Activity  . Alcohol use: Yes    Comment: socially  . Drug use: No  . Sexual activity: Yes

## 2018-02-20 ENCOUNTER — Ambulatory Visit (INDEPENDENT_AMBULATORY_CARE_PROVIDER_SITE_OTHER): Payer: BLUE CROSS/BLUE SHIELD | Admitting: Orthopaedic Surgery

## 2018-12-22 ENCOUNTER — Other Ambulatory Visit (INDEPENDENT_AMBULATORY_CARE_PROVIDER_SITE_OTHER): Payer: Self-pay | Admitting: Orthopaedic Surgery

## 2018-12-22 MED ORDER — AMOXICILLIN-POT CLAVULANATE 875-125 MG PO TABS: 1.0000 | ORAL_TABLET | Freq: Two times a day (BID) | ORAL | 0 refills | Status: DC

## 2018-12-22 MED ORDER — MUPIROCIN 2 % EX OINT: 1.0000 "application " | TOPICAL_OINTMENT | Freq: Two times a day (BID) | CUTANEOUS | 0 refills | Status: DC

## 2019-02-20 ENCOUNTER — Encounter: Payer: Self-pay | Admitting: Orthopaedic Surgery

## 2019-02-20 ENCOUNTER — Other Ambulatory Visit: Payer: Self-pay

## 2019-02-20 ENCOUNTER — Ambulatory Visit: Payer: Self-pay

## 2019-02-20 ENCOUNTER — Ambulatory Visit (INDEPENDENT_AMBULATORY_CARE_PROVIDER_SITE_OTHER): Payer: Medicare Other | Admitting: Orthopaedic Surgery

## 2019-02-20 DIAGNOSIS — Z96642 Presence of left artificial hip joint: Secondary | ICD-10-CM

## 2019-02-20 NOTE — Progress Notes (Signed)
   Post-Op Visit Note   Patient: [name]           Date of Birth: 1955/12/25           MRN: [id number] Visit Date: 02/20/2019 PCP: Moshe Cipro, MD   Assessment & Plan:  Chief Complaint:  Chief Complaint  Patient presents with  . Left Hip - Pain   Visit Diagnoses:  1. Status post total hip replacement, left     Plan: Patient is a pleasant 63 year old gentleman who presents our clinic today 2 years status post left anterior total hip replacement, date of surgery 03/03/2017.  He has been doing excellent.  He has returned to full activities without complication.  He does take Tylenol as needed.  Stable exam of the left hip.  At this point, he will continue to increase activity as tolerated.  He will follow-up with [place] in 3 years time for repeat evaluation and x-rays.  Call with concerns or questions in the meantime.  Follow-Up Instructions: Return in about 1 year (around 02/20/2020).   Orders:  Orders Placed This Encounter  Procedures  . XR HIP UNILAT W OR W/O PELVIS 2-3 VIEWS LEFT   No orders of the defined types were placed in this encounter.   Imaging: Xr Hip Unilat W Or W/o Pelvis 2-3 Views Left  Result Date: 02/20/2019 X-rays of the left hip demonstrate a well-seated prosthesis without complication   PMFS History: Patient Active Problem List   Diagnosis Date Noted  . History of total hip replacement 03/03/2017  . Unilateral primary osteoarthritis, left hip 09/18/2016   Past Medical History:  Diagnosis Date  . Arthritis   . Blood dyscrasia    multiples myloma  . 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 Dental crowns present    loose upper front crown  . GERD (gastroesophageal reflux disease)   . High cholesterol   . History of hiatal hernia   . History of kidney stones   . Numbness of foot    right toe    Family History  Problem Relation Age of Onset  . Colon cancer Father 74    Past Surgical History:  Procedure Laterality Date  . ANTERIOR CRUCIATE  LIGAMENT REPAIR Right   . BACK SURGERY     fusion  . CHOLECYSTECTOMY  1997  . COLONOSCOPY    . EXTRACORPOREAL SHOCK WAVE LITHOTRIPSY Right 04/10/2014   Procedure: EXTRACORPOREAL SHOCK WAVE LITHOTRIPSY (ESWL);  Surgeon: Harle Stanford, MD;  Location: [facility];  Service: Urology;  Laterality: Right;  . LUMBAR LAMINECTOMY  2008  . ROTATOR CUFF REPAIR Bilateral 2001,2007  . TOTAL HIP ARTHROPLASTY Left 03/03/2017   Procedure: LEFT TOTAL HIP ARTHROPLASTY ANTERIOR APPROACH;  Surgeon: Leandrew Koyanagi, MD;  Location: [facility];  Service: Orthopedics;  Laterality: Left;   Social History   Occupational History  . Not on file  Tobacco Use  . Smoking status: Former Smoker    Packs/day: 1.00    Years: 15.00    Pack years: 15.00    Types: Cigarettes  . Smokeless tobacco: Former Systems developer    Quit date: 04/10/1989  Substance and Sexual Activity  . Alcohol use: Yes    Comment: socially  . Drug use: No  . Sexual activity: Yes

## 2019-04-13 ENCOUNTER — Encounter: Payer: Self-pay | Admitting: Internal Medicine

## 2019-09-06 ENCOUNTER — Ambulatory Visit (INDEPENDENT_AMBULATORY_CARE_PROVIDER_SITE_OTHER): Payer: Medicare Other | Admitting: Orthopaedic Surgery

## 2019-09-06 ENCOUNTER — Ambulatory Visit: Payer: Self-pay

## 2019-09-06 ENCOUNTER — Encounter: Payer: Self-pay | Admitting: Orthopaedic Surgery

## 2019-09-06 ENCOUNTER — Ambulatory Visit (INDEPENDENT_AMBULATORY_CARE_PROVIDER_SITE_OTHER): Payer: Medicare Other

## 2019-09-06 ENCOUNTER — Other Ambulatory Visit: Payer: Self-pay | Admitting: Orthopaedic Surgery

## 2019-09-06 ENCOUNTER — Other Ambulatory Visit: Payer: Self-pay

## 2019-09-06 DIAGNOSIS — M25551 Pain in right hip: Secondary | ICD-10-CM

## 2019-09-06 DIAGNOSIS — M545 Low back pain, unspecified: Secondary | ICD-10-CM

## 2019-09-06 MED ORDER — MELOXICAM 7.5 MG PO TABS: 15.0000 mg | ORAL_TABLET | Freq: Every day | ORAL | 2 refills | Status: DC | PRN

## 2019-09-06 MED ORDER — METHOCARBAMOL 500 MG PO TABS: 500.0000 mg | ORAL_TABLET | Freq: Four times a day (QID) | ORAL | 2 refills | Status: DC | PRN

## 2019-09-06 NOTE — Progress Notes (Signed)
Office Visit Note   Patient: [name]           Date of Birth: 02/06/1956           MRN: [id number] Visit Date: 09/06/2019              Requested by: Moshe Cipro, MD [address] [place],  [place] [address] PCP: Moshe Cipro, MD   Assessment & Plan: Visit Diagnoses:  1. Pain in right hip   2. Low back pain, unspecified back pain laterality, unspecified chronicity, unspecified whether sciatica present     Plan: Impression is right hip pain.  Questionable muscle spasm versus symptomatic right hip DJD.  Overall sounds like it is getting better therefore we will continue to treat this symptomatically with meloxicam and Robaxin.  If he does not improve we may need to consider an intra-articular hip injection.  Questions encouraged and answered.  Follow-up in about 6 months for his 3-year left total hip follow-up.  Follow-Up Instructions: Return in about 6 months (around 03/06/2020) for left THA follow up.   Orders:  Orders Placed This Encounter  Procedures  . XR Lumbar Spine 2-3 Views  . XR HIP UNILAT W OR W/O PELVIS 2-3 VIEWS RIGHT   Meds ordered this encounter  Medications  . methocarbamol (ROBAXIN) 500 MG tablet    Sig: Take 1 tablet (500 mg total) by mouth every 6 (six) hours as needed for muscle spasms.    Dispense:  30 tablet    Refill:  2  . meloxicam (MOBIC) 7.5 MG tablet    Sig: Take 2 tablets (15 mg total) by mouth daily as needed for pain.    Dispense:  30 tablet    Refill:  2      Procedures: No procedures performed   Clinical Data: No additional findings.   Subjective: Chief Complaint  Patient presents with  . Right Hip - Pain    [name] is a 63 year old gentleman who is well-known to me who is status post left total hip replacement 2018.  He comes in for evaluation of right hip and groin pain for several weeks.  He states that it hurts to sit on the toilet and to transition from sit to stand.  Sometimes he feels some muscle spasms.  He  denies any radicular symptoms.  There is some occasional buttock pain.   Review of Systems  Constitutional: Negative.   All other systems reviewed and are negative.    Objective: Vital Signs: There were no vitals taken for this visit.  Physical Exam Vitals signs and nursing note reviewed.  Constitutional:      Appearance: He is well-developed.  Pulmonary:     Effort: Pulmonary effort is normal.  Abdominal:     Palpations: Abdomen is soft.  Skin:    General: Skin is warm.  Neurological:     Mental Status: He is alert and oriented to person, place, and time.  Psychiatric:        Behavior: Behavior normal.        Thought Content: Thought content normal.        Judgment: Judgment normal.     Ortho Exam Right hip exam shows good range of motion without significant pain.  Negative logroll and negative Stinchfield and negative FADIR.  FABER test causes a little bit of posterior lateral hip pain.  Lumbar spine is nontender.  Negative sciatic tension signs.  Specialty Comments:  No specialty comments available.  Imaging: Xr  Hip Unilat W Or W/o Pelvis 2-3 Views Right  Result Date: 09/06/2019 Stable left total hip replacement.  Moderate right hip DJD.  Xr Lumbar Spine 2-3 Views  Result Date: 09/06/2019 Stable lumbar spinal fixation.  No evidence of acute abnormalities.  There is multilevel degenerative disc disease with anterior spurring    PMFS History: Patient Active Problem List   Diagnosis Date Noted  . History of total hip replacement 03/03/2017  . Unilateral primary osteoarthritis, left hip 09/18/2016   Past Medical History:  Diagnosis Date  . Arthritis   . Blood dyscrasia    multiples myloma  . Cancer ([place])    multiple myloma  . Dental crowns present    loose upper front crown  . GERD (gastroesophageal reflux disease)   . High cholesterol   . History of hiatal hernia   . History of kidney stones   . Numbness of foot    right toe    Family History   Problem Relation Age of Onset  . Colon cancer Father 50    Past Surgical History:  Procedure Laterality Date  . ANTERIOR CRUCIATE LIGAMENT REPAIR Right   . BACK SURGERY     fusion  . CHOLECYSTECTOMY  1997  . COLONOSCOPY    . EXTRACORPOREAL SHOCK WAVE LITHOTRIPSY Right 04/10/2014   Procedure: EXTRACORPOREAL SHOCK WAVE LITHOTRIPSY (ESWL);  Surgeon: Harle Stanford, MD;  Location: [facility];  Service: Urology;  Laterality: Right;  . LUMBAR LAMINECTOMY  2008  . ROTATOR CUFF REPAIR Bilateral 2001,2007  . TOTAL HIP ARTHROPLASTY Left 03/03/2017   Procedure: LEFT TOTAL HIP ARTHROPLASTY ANTERIOR APPROACH;  Surgeon: Leandrew Koyanagi, MD;  Location: [facility];  Service: Orthopedics;  Laterality: Left;   Social History   Occupational History  . Not on file  Tobacco Use  . Smoking status: Former Smoker    Packs/day: 1.00    Years: 15.00    Pack years: 15.00    Types: Cigarettes  . Smokeless tobacco: Former Systems developer    Quit date: 04/10/1989  Substance and Sexual Activity  . Alcohol use: Yes    Comment: socially  . Drug use: No  . Sexual activity: Yes

## 2019-09-07 ENCOUNTER — Telehealth: Payer: Self-pay | Admitting: Orthopaedic Surgery

## 2019-09-07 NOTE — Telephone Encounter (Signed)
Estill Bamberg, Pharmacist from [facility] needs clarification on the Flexeril that was sent by Mendel Ryder today for the patient.  CB#[phone].  Thank you.

## 2019-09-07 NOTE — Telephone Encounter (Signed)
Please resend Rx  With correct instructions, quantity and refill. Thanks.

## 2019-09-07 NOTE — Telephone Encounter (Signed)
Message Sent in error

## 2019-09-08 ENCOUNTER — Other Ambulatory Visit: Payer: Self-pay | Admitting: Physician Assistant

## 2019-09-08 MED ORDER — CYCLOBENZAPRINE HCL 5 MG PO TABS: 5.0000 mg | ORAL_TABLET | Freq: Three times a day (TID) | ORAL | 3 refills | Status: DC | PRN

## 2019-09-08 NOTE — Addendum Note (Signed)
Addended by: Azucena Cecil on: 09/08/2019 11:41 AM   Modules accepted: Orders

## 2019-09-08 NOTE — Telephone Encounter (Signed)
Does he actually need a refill?  This was one of those automatic requests that I just said approve all assuming it was a refill from a previous rx

## 2019-09-10 NOTE — Telephone Encounter (Signed)
Called patient and states Dr Erlinda Hong took care of it.

## 2019-12-24 ENCOUNTER — Encounter: Payer: Self-pay | Admitting: Internal Medicine

## 2020-01-21 ENCOUNTER — Encounter: Payer: Self-pay | Admitting: Internal Medicine

## 2020-01-21 ENCOUNTER — Ambulatory Visit (INDEPENDENT_AMBULATORY_CARE_PROVIDER_SITE_OTHER): Payer: Medicare Other | Admitting: Internal Medicine

## 2020-01-21 ENCOUNTER — Other Ambulatory Visit (INDEPENDENT_AMBULATORY_CARE_PROVIDER_SITE_OTHER): Payer: Medicare Other

## 2020-01-21 VITALS — BP 120/50 | HR 64 | Temp 98.3°F | Ht 73.0 in | Wt 189.2 lb

## 2020-01-21 DIAGNOSIS — R197 Diarrhea, unspecified: Secondary | ICD-10-CM

## 2020-01-21 DIAGNOSIS — Z800 Family history of malignant neoplasm of digestive organs: Secondary | ICD-10-CM | POA: Diagnosis not present

## 2020-01-21 DIAGNOSIS — R748 Abnormal levels of other serum enzymes: Secondary | ICD-10-CM | POA: Diagnosis not present

## 2020-01-21 DIAGNOSIS — R932 Abnormal findings on diagnostic imaging of liver and biliary tract: Secondary | ICD-10-CM

## 2020-01-21 DIAGNOSIS — C9001 Multiple myeloma in remission: Secondary | ICD-10-CM

## 2020-01-21 DIAGNOSIS — Z9481 Bone marrow transplant status: Secondary | ICD-10-CM

## 2020-01-21 LAB — HEPATIC FUNCTION PANEL
ALT: 24 U/L (ref 0–53)
AST: 27 U/L (ref 0–37)
Albumin: 4.4 g/dL (ref 3.5–5.2)
Alkaline Phosphatase: 56 U/L (ref 39–117)
Bilirubin, Direct: 0.2 mg/dL (ref 0.0–0.3)
Total Bilirubin: 1.2 mg/dL (ref 0.2–1.2)
Total Protein: 6.8 g/dL (ref 6.0–8.3)

## 2020-01-21 NOTE — Patient Instructions (Signed)
You have been scheduled for a colonoscopy. Please follow written instructions given to you at your visit today.  Please pick up your prep supplies at the pharmacy within the next 1-3 days. If you use inhalers (even only as needed), please bring them with you on the day of your procedure.  Your provider has requested that you go to the basement level for lab work before leaving today. Press "B" on the elevator. The lab is located at the first door on the left as you exit the elevator.  You have been scheduled for an abdominal ultrasound at [facility] on 01/25/20 at 8:15am. Please arrive 15 minutes prior to your appointment for registration. Make certain not to have anything to eat or drink 6 hours prior to your appointment. Should you need to reschedule your appointment, please contact radiology at [phone]. This test typically takes about 30 minutes to perform.  Thank you for choosing me and [facility].  Dr. Carlean Purl

## 2020-01-21 NOTE — Progress Notes (Signed)
[name] 64 y.o. 01/10/1956 [id number]  Assessment & Plan:   Encounter Diagnoses  Name Primary?   Diarrhea, unspecified type Yes   Family history of colon cancer in father    Abnormal transaminases    Abnormal liver ultrasound    Multiple myeloma in remission ([place])    S/P autologous bone marrow transplantation [facility])     Regarding the diarrhea I think a colonoscopy is reasonable he is on his 5-year anniversary in June regarding screening colonoscopy.  Low threshold to take random colon biopsies.  I think given that both Velcade and Revlimid can cause diarrhea they seem to be the most likely culprits.  It does seem to be, fortunately, manageable with intermittent small doses of loperamide.  The risks and benefits as well as alternatives of endoscopic procedure(s) have been discussed and reviewed. All questions answered. The patient agrees to proceed.   Regarding the abnormal transaminases this is a bit curious as he has never had those abnormal at [facility] except 1 time in August 2019 AST of 42 but I think that was at the time of his transplant.  Slight elevation in bilirubin one time as well last month.  He does not seem to fit the profile for somebody with fatty liver and I explained that increasing echodensity in the liver is most frequently that so radiologist typically mention that as a possibility.  His lipids his body habitus all go against this.  Velcade and Revlimid may cause "hepatotoxicity" per my review.  What ever this is a does not seem to be serious.  He is obviously understandably concerned about a potential abnormality.  We are going to repeat LFTs check a hepatitis C antibody for completeness, and repeat an ultrasound.  Further plans pending that.  He was started on vitamin E but I do not think that is necessary.  I appreciate the opportunity to care for this patient. CC: Jesus Cipro, MD Dr. 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  He has been having at least 8 months of episodic diarrhea which responds to loperamide, and also has a complaint about fatty liver.  Regarding the diarrhea some days are okay but if he is going to travel he will take a prophylactic Imodium or 2, which works well.  Stools range from normal to soft or mushy to watery at times.  Sometimes he has concern about whether or not he will pass gas or leak stool.  He does note that both Velcade and Revlimid may induce diarrhea.  He has been on these since 2019.  Last colonoscopy in 2016 was negative this was being done every 5 years due to his father having colon cancer at age 61.  Regarding fatty liver he has had transient episodic elevations of his transaminases, ultrasound was performed in [place] on May 03, 2019 with a normal sized liver and "liver appears mildly echogenic to the kidney on some views".  In the impression the radiologist indicated that this could be fatty liver.  His most recent LFTs at [facility] on 323 showed a bilirubin of 1.6 normal alk phos at 51 ALT 37 and AST 34.  And going back into 2019 they are always normal at [facility].  The bilirubin elevation was new.  On December 18, 2019 up in [place] transaminases were about 2 times abnormal, he had testing in July 2020 with an AST of 46 with top normal 37 and ALT of 73 and a normal alk phos, his indirect bili was 0.7 total 0.9.  In October 2020 his AST was 32 ALT 43 both of which were normal.  January 17, 2020 cholesterol 131 triglycerides 79 HDL 65 LDL 50 vitamin D level 47 TSH normal 0.65   He thinks he has been tested for hepatitis C but I cannot see findings of that.  After his stem cell transplant he is being vaccinated for hepatitis B, pneumonia Tdap and shingles.  He has to wait a couple of  years before he get an MMR.  He has had his Covid vaccines as well having the second Moderna immunization on March 5.. Rare alcohol use.  No other new medications.  No muscle aches or myalgias. Allergies  Allergen Reactions   Niacin Other (See Comments)    Flushing, insomnia   Multihance [Gadobenate] Hives   Zocor [Simvastatin] Other (See Comments)    Muscle cramping   Current Meds  Medication Sig   acyclovir (ZOVIRAX) 400 MG tablet SMARTSIG:1 Tablet(s) By Mouth Every 12 Hours   aspirin EC 81 MG tablet Take 81 mg by mouth daily.   atorvastatin (LIPITOR) 10 MG tablet Take 10 mg by mouth every other day. In the morning.   B Complex Vitamins (VITAMIN B COMPLEX PO) Take 1 tablet by mouth daily.   bortezomib IV (VELCADE) 3.5 MG injection Inject 1.3 mg/m2 into the vein. Every other week   Cholecalciferol (VITAMIN D3) 2000 units TABS Take 2,000 Units by mouth daily.   cyanocobalamin 1000 MCG tablet Take 1,000 mcg by mouth daily.   doxycycline (VIBRAMYCIN) 50 MG capsule Take 50 mg by mouth daily.   loperamide (IMODIUM A-D) 2 MG capsule Take 2 mg by mouth as needed for diarrhea or loose stools.   meloxicam (MOBIC) 7.5 MG tablet Take 2 tablets (15 mg total) by mouth daily as needed for pain.   omeprazole (PRILOSEC) 20 MG capsule Take 20 mg by mouth every morning.   REVLIMID 10 MG capsule Take 1 capsule by mouth. Every day for 21 days and rest 7   vitamin E 180 MG (400 UNITS) capsule Take 1 capsule by mouth in the morning and at bedtime.   Past Medical History:  Diagnosis Date   Arthritis    Blood dyscrasia    multiples myloma   Dental crowns present    loose upper front crown   Diverticulosis    GERD (gastroesophageal reflux disease)    High cholesterol    History of hiatal hernia    History of kidney stones    Multiple myeloma ([facility])    Numbness of foot    right toe   Past Surgical History:  Procedure Laterality Date   ANTERIOR CRUCIATE LIGAMENT REPAIR  Right    BONE MARROW TRANSPLANT  2019   Autologous-multiple myeloma   CHOLECYSTECTOMY  1997   COLONOSCOPY     EXTRACORPOREAL SHOCK WAVE LITHOTRIPSY Right 04/10/2014   Procedure: EXTRACORPOREAL SHOCK WAVE LITHOTRIPSY (ESWL);  Surgeon: Harle Stanford, MD;  Location: [facility];  Service: Urology;  Laterality: Right;   LUMBAR FUSION     fusion   LUMBAR LAMINECTOMY  2008   ROTATOR CUFF REPAIR Bilateral 2001,2007   TOTAL HIP ARTHROPLASTY Left 03/03/2017   Procedure: LEFT TOTAL HIP ARTHROPLASTY ANTERIOR APPROACH;  Surgeon: Leandrew Koyanagi, MD;  Location:  [facility];  Service: Orthopedics;  Laterality: Left;   Social History   Social History Narrative   Married with 2 children, he is a retired Therapist, sports   Rare alcohol use, 1.5 caffeinated beverages a day, former smoker no drug or tobacco use otherwise   family history includes Colon cancer (age of onset: 26) in his father; Colon polyps in his father; Heart disease in his brother, brother, and father; Kidney Stones in his father; Pancreatic cancer in his father.   Review of Systems As per HPI.  He has had some "plugged eye ducts" and is on doxycycline for those and he has some back pain issues.  All other review of systems are negative.  Objective:   Physical Exam BP (!) 120/50 (BP Location: Left Arm, Patient Position: Sitting, Cuff Size: Normal)    Pulse 64    Temp 98.3 F (36.8 C)    Ht 6' 1"  (1.854 m) Comment: height measured without shoes   Wt 189 lb 4 oz (85.8 kg)    BMI 24.97 kg/m  NAD Anicteric Lungs cta Cor NL abd soft nt no hsm, small bruise RLQ (Velcade) Skin - no stigmata chronic liver disease Alert and oriented x3 Appropriate mood and affect  Data reviewed includes previous colonoscopy 2016 labs as mentioned in the HPI I have reviewed 2021 and 2020 notes regarding his oncology care at [facility].

## 2020-01-23 LAB — HEPATITIS C ANTIBODY
Hepatitis C Ab: NONREACTIVE
SIGNAL TO CUT-OFF: 0.01 (ref <1.00)

## 2020-01-25 ENCOUNTER — Ambulatory Visit (HOSPITAL_COMMUNITY)
Admission: RE | Admit: 2020-01-25 | Discharge: 2020-01-25 | Disposition: A | Discharge status: Home / Self Care | Payer: Medicare Other | Source: Ambulatory Visit | Attending: Internal Medicine | Admitting: Internal Medicine

## 2020-01-25 ENCOUNTER — Other Ambulatory Visit: Payer: Self-pay

## 2020-01-25 DIAGNOSIS — R748 Abnormal levels of other serum enzymes: Secondary | ICD-10-CM | POA: Diagnosis present

## 2020-01-25 DIAGNOSIS — R932 Abnormal findings on diagnostic imaging of liver and biliary tract: Secondary | ICD-10-CM | POA: Diagnosis present

## 2020-03-04 ENCOUNTER — Encounter: Payer: Self-pay | Admitting: Internal Medicine

## 2020-03-04 ENCOUNTER — Other Ambulatory Visit: Payer: Self-pay

## 2020-03-04 ENCOUNTER — Ambulatory Visit (AMBULATORY_SURGERY_CENTER): Payer: Medicare Other | Admitting: Internal Medicine

## 2020-03-04 VITALS — BP 115/62 | HR 50 | Temp 97.3°F | Resp 10 | Ht 73.0 in | Wt 189.0 lb

## 2020-03-04 DIAGNOSIS — K5730 Diverticulosis of large intestine without perforation or abscess without bleeding: Secondary | ICD-10-CM | POA: Diagnosis not present

## 2020-03-04 DIAGNOSIS — R197 Diarrhea, unspecified: Secondary | ICD-10-CM

## 2020-03-04 DIAGNOSIS — Z800 Family history of malignant neoplasm of digestive organs: Secondary | ICD-10-CM

## 2020-03-04 MED ORDER — SODIUM CHLORIDE 0.9 % IV SOLN: 500.0000 mL | Freq: Once | INTRAVENOUS | Status: DC

## 2020-03-04 NOTE — Op Note (Signed)
[facility] Patient Name: [name] Procedure Date: 03/04/2020 7:19 AM MRN: [id number] Endoscopist: Gatha Mayer , MD Age: 64 Referring MD:  Date of Birth: Apr 20, 1956 Gender: Male Account #: [id number] Procedure:                Colonoscopy Indications:              Clinically significant diarrhea of unexplained                            origin Medicines:                Propofol per Anesthesia, Monitored Anesthesia Care Procedure:                Pre-Anesthesia Assessment:                           - Prior to the procedure, a History and Physical                            was performed, and patient medications and                            allergies were reviewed. The patient's tolerance of                            previous anesthesia was also reviewed. The risks                            and benefits of the procedure and the sedation                            options and risks were discussed with the patient.                            All questions were answered, and informed consent                            was obtained. Prior Anticoagulants: The patient has                            taken no previous anticoagulant or antiplatelet                            agents. ASA Grade Assessment: II - A patient with                            mild systemic disease. After reviewing the risks                            and benefits, the patient was deemed in                            satisfactory condition to undergo the procedure.  After obtaining informed consent, the colonoscope                            was passed under direct vision. Throughout the                            procedure, the patient's blood pressure, pulse, and                            oxygen saturations were monitored continuously. The                            Colonoscope was introduced through the anus and                            advanced to the the terminal ileum,  with                            identification of the appendiceal orifice and IC                            valve. The colonoscopy was performed without                            difficulty. The patient tolerated the procedure                            well. The quality of the bowel preparation was                            good. The terminal ileum, ileocecal valve,                            appendiceal orifice, and rectum were photographed.                            The bowel preparation used was Miralax via split                            dose instruction. Scope In: 8:09:01 AM Scope Out: 8:22:17 AM Scope Withdrawal Time: 0 hours 10 minutes 18 seconds  Total Procedure Duration: 0 hours 13 minutes 16 seconds  Findings:                 The perianal and digital rectal examinations were                            normal. Pertinent negatives include normal prostate                            (size, shape, and consistency).                           The terminal ileum appeared normal.  Multiple small-mouthed diverticula were found in                            the sigmoid colon.                           The exam was otherwise without abnormality on                            direct and retroflexion views.                           Biopsies for histology were taken with a cold                            forceps from the ascending colon, transverse colon,                            descending colon and sigmoid colon for evaluation                            of microscopic colitis. Complications:            No immediate complications. Estimated Blood Loss:     Estimated blood loss was minimal. Impression:               - The examined portion of the ileum was normal.                           - Diverticulosis in the sigmoid colon.                           - The examination was otherwise normal on direct                            and retroflexion views.                            - Biopsies were taken with a cold forceps from the                            ascending colon, transverse colon, descending colon                            and sigmoid colon for evaluation of microscopic                            colitis. Recommendation:           - Patient has a contact number available for                            emergencies. The signs and symptoms of potential                            delayed complications were discussed with the  patient. Return to normal activities tomorrow.                            Written discharge instructions were provided to the                            patient.                           - Resume previous diet.                           - Continue present medications.                           - Await pathology results. Probably having diarrhea                            from medications but wanted to be more sure.                           - Repeat colonoscopy in 5 years for screening                            purposes. Family Hx CRCA father < 38 Gatha Mayer, MD 03/04/2020 8:32:49 AM This report has been signed electronically.

## 2020-03-04 NOTE — Progress Notes (Signed)
Report given to PACU, vss 

## 2020-03-04 NOTE — Patient Instructions (Addendum)
No polyps, cancer or inflammation seen.  I went ahead and took random biopsies to look for microscopic inflammation or colitis. Probably not present but wanted to be sure.  I appreciate the opportunity to care for you. Gatha Mayer, MD, FACG    YOU HAD AN ENDOSCOPIC PROCEDURE TODAY AT [facility]:   Refer to the procedure report that was given to you for any specific questions about what was found during the examination.  If the procedure report does not answer your questions, please call your gastroenterologist to clarify.  If you requested that your care partner not be given the details of your procedure findings, then the procedure report has been included in a sealed envelope for you to review at your convenience later.  YOU SHOULD EXPECT: Some feelings of bloating in the abdomen. Passage of more gas than usual.  Walking can help get rid of the air that was put into your GI tract during the procedure and reduce the bloating. If you had a lower endoscopy (such as a colonoscopy or flexible sigmoidoscopy) you may notice spotting of blood in your stool or on the toilet paper. If you underwent a bowel prep for your procedure, you may not have a normal bowel movement for a few days.  Please Note:  You might notice some irritation and congestion in your nose or some drainage.  This is from the oxygen used during your procedure.  There is no need for concern and it should clear up in a day or so.  SYMPTOMS TO REPORT IMMEDIATELY:   Following lower endoscopy (colonoscopy or flexible sigmoidoscopy):  Excessive amounts of blood in the stool  Significant tenderness or worsening of abdominal pains  Swelling of the abdomen that is new, acute  Fever of 100F or higher   For urgent or emergent issues, a gastroenterologist can be reached at any hour by calling [phone]. Do not use MyChart messaging for urgent concerns.    DIET:  We do recommend a small meal at first, but then  you may proceed to your regular diet.  Drink plenty of fluids but you should avoid alcoholic beverages for 24 hours.  ACTIVITY:  You should plan to take it easy for the rest of today and you should NOT DRIVE or use heavy machinery until tomorrow (because of the sedation medicines used during the test).    FOLLOW UP: Our staff will call the number listed on your records 48-72 hours following your procedure to check on you and address any questions or concerns that you may have regarding the information given to you following your procedure. If we do not reach you, we will leave a message.  We will attempt to reach you two times.  During this call, we will ask if you have developed any symptoms of COVID 19. If you develop any symptoms (ie: fever, flu-like symptoms, shortness of breath, cough etc.) before then, please call [phone].  If you test positive for Covid 19 in the 2 weeks post procedure, please call and report this information to [place].    If any biopsies were taken you will be contacted by phone or by letter within the next 1-3 weeks.  Please call us at [phone] if you have not heard about the biopsies in 3 weeks.    SIGNATURES/CONFIDENTIALITY: You and/or your care partner have signed paperwork which will be entered into your electronic medical record.  These signatures attest to the fact that that the information  above on your After Visit Summary has been reviewed and is understood.  Full responsibility of the confidentiality of this discharge information lies with you and/or your care-partner.     Handout was given to you on Diverticulosis. You may resume your current medications today. Random colon biopsies were taken to rule out microscopic colitis.  Await pathology results.  Dr. Nita Sickle will notify you of your result via My Chart, letter in the mail or phone call. Please call if any questions or concerns.

## 2020-03-04 NOTE — Progress Notes (Signed)
Called to room to assist during endoscopic procedure.  Patient ID and intended procedure confirmed with present staff. Received instructions for my participation in the procedure from the performing physician.  

## 2020-03-04 NOTE — Progress Notes (Signed)
No problems noted in the recovery room. maw 

## 2020-03-06 ENCOUNTER — Other Ambulatory Visit: Payer: Self-pay

## 2020-03-06 ENCOUNTER — Encounter: Payer: Self-pay | Admitting: Orthopaedic Surgery

## 2020-03-06 ENCOUNTER — Telehealth: Payer: Self-pay | Admitting: *Deleted

## 2020-03-06 ENCOUNTER — Ambulatory Visit (INDEPENDENT_AMBULATORY_CARE_PROVIDER_SITE_OTHER): Payer: Medicare Other

## 2020-03-06 ENCOUNTER — Ambulatory Visit (INDEPENDENT_AMBULATORY_CARE_PROVIDER_SITE_OTHER): Payer: Medicare Other | Admitting: Orthopaedic Surgery

## 2020-03-06 DIAGNOSIS — Z96642 Presence of left artificial hip joint: Secondary | ICD-10-CM | POA: Diagnosis not present

## 2020-03-06 NOTE — Telephone Encounter (Signed)
  Follow up Call-  Call back number 03/04/2020  Post procedure Call Back phone  # [phone]  Permission to leave phone message Yes  Some recent data might be hidden     Patient questions:  Do you have a fever, pain , or abdominal swelling? No. Pain Score  0 *  Have you tolerated food without any problems? Yes.    Have you been able to return to your normal activities? Yes.    Do you have any questions about your discharge instructions: Diet   No. Medications  No. Follow up visit  No.  Do you have questions or concerns about your Care? No.  Actions: * If pain score is 4 or above: No action needed, pain <4.  1. Have you developed a fever since your procedure? no  2.   Have you had an respiratory symptoms (SOB or cough) since your procedure? no  3.   Have you tested positive for COVID 19 since your procedure no  4.   Have you had any family members/close contacts diagnosed with the COVID 19 since your procedure?  no   If yes to any of these questions please route to Joylene John, RN and Erenest Rasher, RN

## 2020-03-06 NOTE — Progress Notes (Signed)
Office Visit Note   Patient: [name]           Date of Birth: Oct 01, 1956           MRN: [id number] Visit Date: 03/06/2020              Requested by: Moshe Cipro, MD [address] [place],   [id number] PCP: Moshe Cipro, MD   Assessment & Plan: Visit Diagnoses:  1. Status post total hip replacement, left     Plan: [name] is doing very well from his hip replacement.  He is now 3 years out.  X-rays do not show any complications or evidence of loosening.  Recheck in 2 years for his 5-year visit.  Follow-Up Instructions: Return in about 2 years (around 03/06/2022).   Orders:  Orders Placed This Encounter  Procedures   XR HIP UNILAT W OR W/O PELVIS 2-3 VIEWS LEFT   No orders of the defined types were placed in this encounter.     Procedures: No procedures performed   Clinical Data: No additional findings.   Subjective: Chief Complaint  Patient presents with   Right Hip - Pain    [name] is 3 years status post left total hip replacement.  Overall doing very well and very happy.  He has some back pain.  He is doing push-ups and stationary bike on a daily basis.   Review of Systems   Objective: Vital Signs: There were no vitals taken for this visit.  Physical Exam  Ortho Exam Surgical scar fully healed.  Leg lengths equal.  Full range of motion of the hip without pain. Specialty Comments:  No specialty comments available.  Imaging: No results found.   PMFS History: Patient Active Problem List   Diagnosis Date Noted   Abnormal transaminases 01/21/2020   Diarrhea 01/21/2020   Multiple myeloma in remission ([facility]) 01/21/2020   S/P autologous bone marrow transplantation ([facility]) 01/21/2020   History of total hip replacement 03/03/2017   Unilateral primary osteoarthritis, left hip 09/18/2016   Past Medical History:  Diagnosis Date   Arthritis    Blood dyscrasia    multiples myloma   Blood transfusion without reported diagnosis     Chronic kidney disease    kidney stones   Dental crowns present    loose upper front crown   Diverticulosis    GERD (gastroesophageal reflux disease)    High cholesterol    History of hiatal hernia    History of kidney stones    Multiple myeloma (HCC)    Numbness of foot    right toe    Family History  Problem Relation Age of Onset   Colon cancer Father 43   Pancreatic cancer Father    Colon polyps Father    Heart disease Father    Kidney Stones Father    Heart disease Brother    Heart disease Brother     Past Surgical History:  Procedure Laterality Date   ANTERIOR CRUCIATE LIGAMENT REPAIR Right    BONE MARROW TRANSPLANT  2019   Autologous-multiple myeloma   CHOLECYSTECTOMY  1997   COLONOSCOPY     EXTRACORPOREAL SHOCK WAVE LITHOTRIPSY Right 04/10/2014   Procedure: EXTRACORPOREAL SHOCK WAVE LITHOTRIPSY (ESWL);  Surgeon: Harle Stanford, MD;  Location: [facility];  Service: Urology;  Laterality: Right;   LUMBAR FUSION     fusion   LUMBAR LAMINECTOMY  2008   ROTATOR CUFF REPAIR Bilateral 2001,2007   TOTAL HIP ARTHROPLASTY Left 03/03/2017  Procedure: LEFT TOTAL HIP ARTHROPLASTY ANTERIOR APPROACH;  Surgeon: Leandrew Koyanagi, MD;  Location: [facility];  Service: Orthopedics;  Laterality: Left;   Social History   Occupational History   Occupation: retired Therapist, sports  Tobacco Use   Smoking status: Former Smoker    Packs/day: 1.00    Years: 15.00    Pack years: 15.00    Types: Cigarettes    Quit date: 1990    Years since quitting: 31.4   Smokeless tobacco: Never Used  Substance and Sexual Activity   Alcohol use: Yes    Comment: 1-2 per week   Drug use: No   Sexual activity: Yes

## 2020-08-13 ENCOUNTER — Ambulatory Visit (INDEPENDENT_AMBULATORY_CARE_PROVIDER_SITE_OTHER): Payer: Medicare Other | Admitting: Orthopaedic Surgery

## 2020-08-13 ENCOUNTER — Ambulatory Visit (INDEPENDENT_AMBULATORY_CARE_PROVIDER_SITE_OTHER): Payer: Medicare Other

## 2020-08-13 ENCOUNTER — Encounter: Payer: Self-pay | Admitting: Orthopaedic Surgery

## 2020-08-13 DIAGNOSIS — M7661 Achilles tendinitis, right leg: Secondary | ICD-10-CM

## 2020-08-13 MED ORDER — MELOXICAM 7.5 MG PO TABS: 15.0000 mg | ORAL_TABLET | Freq: Every day | ORAL | 2 refills | Status: DC | PRN

## 2020-08-13 NOTE — Progress Notes (Addendum)
Office Visit Note   Patient: [name]           Date of Birth: [birth year]           MRN: [id number] Visit Date: 08/13/2020              Requested by: Moshe Cipro, MD [address] [place],  [place] [address] PCP: Moshe Cipro, MD   Assessment & Plan: Visit Diagnoses:  1. Right Achilles tendinitis     Plan: Impression is a Achilles tendinosis of the right side.  I have recommended combination of relative rest and immobilization with Cam boot and Voltaren gel which she has at home.  I refilled his meloxicam.  We also discussed physical therapy nitroglycerin patches but he will let me know if he is interested in this.  We will see him back as needed.  Follow-Up Instructions: Return if symptoms worsen or fail to improve.   Orders:  Orders Placed This Encounter  Procedures  . XR Os Calcis Right   Meds ordered this encounter  Medications  . meloxicam (MOBIC) 7.5 MG tablet    Sig: Take 2 tablets (15 mg total) by mouth daily as needed for pain.    Dispense:  30 tablet    Refill:  2      Procedures: No procedures performed   Clinical Data: No additional findings.   Subjective: Chief Complaint  Patient presents with  . Right Foot - Pain    [name] is a 64 year old gentleman who is well-known to me comes in for evaluation of pain over the right Achilles tendon for about a month.  Denies any injuries or recent trauma.  Mobic does help which he takes occasionally.  The pain is constant most of the time and worse with going up stairs.   Review of Systems  Constitutional: Negative.   All other systems reviewed and are negative.    Objective: Vital Signs: There were no vitals taken for this visit.  Physical Exam Vitals and nursing note reviewed.  Constitutional:      Appearance: He is well-developed.  Pulmonary:     Effort: Pulmonary effort is normal.  Abdominal:     Palpations: Abdomen is soft.  Skin:    General: Skin is warm.  Neurological:      Mental Status: He is alert and oriented to person, place, and time.  Psychiatric:        Behavior: Behavior normal.        Thought Content: Thought content normal.        Judgment: Judgment normal.     Ortho Exam Right heel and Achilles showed no insertional Achilles tenderness.  He has swelling and bolus area in the mid substance of the tendon.  The tendon is in continuity.  He is able to perform a single heel raise. Specialty Comments:  No specialty comments available.  Imaging: XR Os Calcis Right  Result Date: 08/13/2020 Small plantar calcaneal spur    PMFS History: Patient Active Problem List   Diagnosis Date Noted  . Right Achilles tendinitis 08/13/2020  . Abnormal transaminases 01/21/2020  . Diarrhea 01/21/2020  . Multiple myeloma in remission ([facility]) 01/21/2020  . S/P autologous bone marrow transplantation ([facility]) 01/21/2020  . History of total hip replacement 03/03/2017  . Unilateral primary osteoarthritis, left hip 09/18/2016   Past Medical History:  Diagnosis Date  . Arthritis   . Blood dyscrasia    multiples myloma  . Blood transfusion without reported diagnosis   .  Chronic kidney disease    kidney stones  . Dental crowns present    loose upper front crown  . Diverticulosis   . GERD (gastroesophageal reflux disease)   . High cholesterol   . History of hiatal hernia   . History of kidney stones   . Multiple myeloma ([facility])   . Numbness of foot    right toe    Family History  Problem Relation Age of Onset  . Colon cancer Father 57  . Pancreatic cancer Father   . Colon polyps Father   . Heart disease Father   . Kidney Stones Father   . Heart disease Brother   . Heart disease Brother     Past Surgical History:  Procedure Laterality Date  . ANTERIOR CRUCIATE LIGAMENT REPAIR Right   . BONE MARROW TRANSPLANT  2019   Autologous-multiple myeloma  . CHOLECYSTECTOMY  1997  . COLONOSCOPY    . EXTRACORPOREAL SHOCK WAVE LITHOTRIPSY Right 04/10/2014    Procedure: EXTRACORPOREAL SHOCK WAVE LITHOTRIPSY (ESWL);  Surgeon: Harle Stanford, MD;  Location: [facility];  Service: Urology;  Laterality: Right;  . LUMBAR FUSION     fusion  . LUMBAR LAMINECTOMY  2008  . ROTATOR CUFF REPAIR Bilateral 2001,2007  . TOTAL HIP ARTHROPLASTY Left 03/03/2017   Procedure: LEFT TOTAL HIP ARTHROPLASTY ANTERIOR APPROACH;  Surgeon: Leandrew Koyanagi, MD;  Location: [facility];  Service: Orthopedics;  Laterality: Left;   Social History   Occupational History  . Occupation: retired Therapist, sports  Tobacco Use  . Smoking status: Former Smoker    Packs/day: 1.00    Years: 15.00    Pack years: 15.00    Types: Cigarettes    Quit date: 1990    Years since quitting: 31.8  . Smokeless tobacco: Never Used  Vaping Use  . Vaping Use: Never used  Substance and Sexual Activity  . Alcohol use: Yes    Comment: 1-2 per week  . Drug use: No  . Sexual activity: Yes

## 2021-02-08 ENCOUNTER — Other Ambulatory Visit: Payer: Self-pay | Admitting: Orthopaedic Surgery

## 2021-03-17 ENCOUNTER — Other Ambulatory Visit: Payer: Self-pay | Admitting: Physician Assistant

## 2021-03-17 ENCOUNTER — Encounter: Payer: Self-pay | Admitting: Orthopaedic Surgery

## 2021-03-17 ENCOUNTER — Other Ambulatory Visit: Payer: Self-pay | Admitting: Orthopaedic Surgery

## 2021-03-17 ENCOUNTER — Telehealth: Payer: Self-pay

## 2021-03-17 ENCOUNTER — Ambulatory Visit: Payer: Self-pay

## 2021-03-17 ENCOUNTER — Ambulatory Visit (INDEPENDENT_AMBULATORY_CARE_PROVIDER_SITE_OTHER): Payer: Medicare Other | Admitting: Orthopaedic Surgery

## 2021-03-17 DIAGNOSIS — G8929 Other chronic pain: Secondary | ICD-10-CM | POA: Diagnosis not present

## 2021-03-17 DIAGNOSIS — M545 Low back pain, unspecified: Secondary | ICD-10-CM | POA: Diagnosis not present

## 2021-03-17 MED ORDER — PREDNISONE 10 MG (21) PO TBPK: ORAL_TABLET | ORAL | 0 refills | Status: DC

## 2021-03-17 MED ORDER — METAXALONE 800 MG PO TABS: 800.0000 mg | ORAL_TABLET | Freq: Three times a day (TID) | ORAL | 0 refills | Status: DC

## 2021-03-17 MED ORDER — METHOCARBAMOL 500 MG PO TABS: 500.0000 mg | ORAL_TABLET | Freq: Two times a day (BID) | ORAL | 0 refills | Status: DC | PRN

## 2021-03-17 MED ORDER — CYCLOBENZAPRINE HCL 5 MG PO TABS: 5.0000 mg | ORAL_TABLET | Freq: Every evening | ORAL | 3 refills | Status: DC | PRN

## 2021-03-17 NOTE — Telephone Encounter (Signed)
Spoke to pharmacist.  Flexeril at night and robaxin for during the day

## 2021-03-17 NOTE — Telephone Encounter (Signed)
Mendel Ryder called pharmacy

## 2021-03-17 NOTE — Telephone Encounter (Signed)
Can she recommend a muscle relaxer that does not have a drug interaction?

## 2021-03-17 NOTE — Progress Notes (Signed)
Office Visit Note   Patient: [name]           Date of Birth: 1956/03/07           MRN: [id number] Visit Date: 03/17/2021              Requested by: Moshe Cipro, MD [address] [place],  [place] [address] PCP: Moshe Cipro, MD   Assessment & Plan: Visit Diagnoses:  1. Chronic right-sided low back pain, unspecified whether sciatica present     Plan: I impression is low back pain which sounds muscular in nature.  I do not get the sense that he has a radiculopathy.  Certainly I have a low suspicion for hip problems.  I have sent in a prescription for Skelaxin and Flexeril to use at nighttime prednisone Dosepak.  We will see him back as needed.  Follow-Up Instructions: Return if symptoms worsen or fail to improve.   Orders:  Orders Placed This Encounter  Procedures   XR Lumbar Spine 2-3 Views   Meds ordered this encounter  Medications   predniSONE (STERAPRED UNI-PAK 21 TAB) 10 MG (21) TBPK tablet    Sig: Take as directed    Dispense:  21 tablet    Refill:  0   metaxalone (SKELAXIN) 800 MG tablet    Sig: Take 1 tablet (800 mg total) by mouth 3 (three) times daily.    Dispense:  20 tablet    Refill:  0   cyclobenzaprine (FLEXERIL) 5 MG tablet    Sig: Take 1-2 tablets (5-10 mg total) by mouth at bedtime as needed for muscle spasms.    Dispense:  20 tablet    Refill:  3      Procedures: No procedures performed   Clinical Data: No additional findings.   Subjective: Chief Complaint  Patient presents with   Lower Back - Pain    [name] is a 65 year old gentleman who comes in for evaluation of right-sided low back pain.  Denies any radiation or groin pain.  He has had a prior lumbar fusion by Dr. Vertell Limber.  He recently got back from [place].  He has taken some Robaxin and Flexeril which helped temporarily and the Flexeril makes him too drowsy to use during the daytime.   Review of Systems  Constitutional: Negative.   All other systems reviewed and are  negative.   Objective: Vital Signs: There were no vitals taken for this visit.  Physical Exam Vitals and nursing note reviewed.  Constitutional:      Appearance: He is well-developed.  Pulmonary:     Effort: Pulmonary effort is normal.  Abdominal:     Palpations: Abdomen is soft.  Skin:    General: Skin is warm.  Neurological:     Mental Status: He is alert and oriented to person, place, and time.  Psychiatric:        Behavior: Behavior normal.        Thought Content: Thought content normal.        Judgment: Judgment normal.    Ortho Exam Right hip exam is unremarkable. Lumbar spine is tender near the right paraspinous muscles.  No radicular symptoms. Specialty Comments:  No specialty comments available.  Imaging: No results found.   PMFS History: Patient Active Problem List   Diagnosis Date Noted   Right Achilles tendinitis 08/13/2020   Abnormal transaminases 01/21/2020   Diarrhea 01/21/2020   Multiple myeloma in remission ([facility]) 01/21/2020   S/P autologous bone marrow  transplantation ([facility]) 01/21/2020   History of total hip replacement 03/03/2017   Unilateral primary osteoarthritis, left hip 09/18/2016   Past Medical History:  Diagnosis Date   Arthritis    Blood dyscrasia    multiples myloma   Blood transfusion without reported diagnosis    Chronic kidney disease    kidney stones   Dental crowns present    loose upper front crown   Diverticulosis    GERD (gastroesophageal reflux disease)    High cholesterol    History of hiatal hernia    History of kidney stones    Multiple myeloma (HCC)    Numbness of foot    right toe    Family History  Problem Relation Age of Onset   Colon cancer Father 6   Pancreatic cancer Father    Colon polyps Father    Heart disease Father    Kidney Stones Father    Heart disease Brother    Heart disease Brother     Past Surgical History:  Procedure Laterality Date   ANTERIOR CRUCIATE LIGAMENT REPAIR Right    BONE  MARROW TRANSPLANT  2019   Autologous-multiple myeloma   CHOLECYSTECTOMY  1997   COLONOSCOPY     EXTRACORPOREAL SHOCK WAVE LITHOTRIPSY Right 04/10/2014   Procedure: EXTRACORPOREAL SHOCK WAVE LITHOTRIPSY (ESWL);  Surgeon: Harle Stanford, MD;  Location: [facility];  Service: Urology;  Laterality: Right;   LUMBAR FUSION     fusion   LUMBAR LAMINECTOMY  2008   ROTATOR CUFF REPAIR Bilateral 2001,2007   TOTAL HIP ARTHROPLASTY Left 03/03/2017   Procedure: LEFT TOTAL HIP ARTHROPLASTY ANTERIOR APPROACH;  Surgeon: Leandrew Koyanagi, MD;  Location: [facility];  Service: Orthopedics;  Laterality: Left;   Social History   Occupational History   Occupation: retired Therapist, sports  Tobacco Use   Smoking status: Former    Packs/day: 1.00    Years: 15.00    Pack years: 15.00    Types: Cigarettes    Quit date: 1990    Years since quitting: 32.4   Smokeless tobacco: Never  Vaping Use   Vaping Use: Never used  Substance and Sexual Activity   Alcohol use: Yes    Comment: 1-2 per week   Drug use: No   Sexual activity: Yes

## 2021-03-17 NOTE — Telephone Encounter (Signed)
**   correction a drug interaction.

## 2021-03-17 NOTE — Telephone Encounter (Signed)
Deja from [facility] called she stated there is a drug integration for metaxalone and cyclobenzaprine call back:[phone]

## 2021-03-17 NOTE — Telephone Encounter (Signed)
Disregard previous message.  He can take one or the other, not both

## 2021-03-17 NOTE — Telephone Encounter (Signed)
Mendel Ryder called pharm.

## 2021-03-23 ENCOUNTER — Other Ambulatory Visit: Payer: Self-pay | Admitting: Physician Assistant

## 2021-03-23 MED ORDER — TRAMADOL HCL 50 MG PO TABS: 50.0000 mg | ORAL_TABLET | Freq: Three times a day (TID) | ORAL | 0 refills | Status: DC | PRN

## 2021-03-23 MED ORDER — TIZANIDINE HCL 2 MG PO TABS: 2.0000 mg | ORAL_TABLET | Freq: Three times a day (TID) | ORAL | 0 refills | Status: DC | PRN

## 2021-03-23 NOTE — Telephone Encounter (Signed)
Called Pharm. They state Robaxin is Non preferred and Tizanidine 2mg  is Preferred. Please send in Tizanidine 2mg  instead

## 2021-03-23 NOTE — Telephone Encounter (Signed)
Is the patient ok with this?  I was talking to his wife and he has been taking some leftover robaxin at home and is working well from what it sounds like

## 2021-03-23 NOTE — Telephone Encounter (Signed)
This patient has still not been able to get the robaxin I sent in last week.  Can you call the pharmacy and see what is going on?

## 2021-03-26 ENCOUNTER — Other Ambulatory Visit: Payer: Self-pay

## 2021-03-26 DIAGNOSIS — M545 Low back pain, unspecified: Secondary | ICD-10-CM

## 2021-03-26 NOTE — Telephone Encounter (Signed)
Can you order mri lumbar spine please

## 2021-04-07 ENCOUNTER — Ambulatory Visit
Admission: RE | Admit: 2021-04-07 | Discharge: 2021-04-07 | Disposition: A | Discharge status: Home / Self Care | Payer: Medicare Other | Source: Ambulatory Visit | Attending: Physician Assistant | Admitting: Physician Assistant

## 2021-04-07 DIAGNOSIS — M545 Low back pain, unspecified: Secondary | ICD-10-CM

## 2021-04-08 NOTE — Progress Notes (Signed)
F/u to discuss

## 2021-04-15 ENCOUNTER — Other Ambulatory Visit: Payer: Medicare Other

## 2021-04-28 ENCOUNTER — Other Ambulatory Visit: Payer: Self-pay

## 2021-04-28 ENCOUNTER — Encounter: Payer: Self-pay | Admitting: Orthopaedic Surgery

## 2021-04-28 ENCOUNTER — Ambulatory Visit (INDEPENDENT_AMBULATORY_CARE_PROVIDER_SITE_OTHER): Payer: Medicare Other | Admitting: Orthopaedic Surgery

## 2021-04-28 VITALS — Ht 73.0 in | Wt 189.0 lb

## 2021-04-28 DIAGNOSIS — M545 Low back pain, unspecified: Secondary | ICD-10-CM | POA: Diagnosis not present

## 2021-04-28 NOTE — Progress Notes (Signed)
Office Visit Note   Patient: [name]           Date of Birth: 06-10-1956           MRN: [id number] Visit Date: 04/28/2021              Requested by: Moshe Cipro, MD [address] [place],  [place] [address] PCP: Moshe Cipro, MD   Assessment & Plan: Visit Diagnoses:  1. 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  The patient was recently seen by Dr. Vertell Limber who recommended a course of physical therapy.  A referral was made in [place] for this.  We have also discussed steroid injections should his symptoms not improve with therapy.  He will follow-up with [place] as needed.  Follow-Up Instructions: Return if symptoms worsen or fail to improve.   Orders:  No orders of the defined types were placed in this encounter.  No orders of the defined types were placed in this encounter.     Procedures: No procedures performed   Clinical Data: No additional findings.   Subjective: Chief Complaint  Patient presents with   Lower Back - Follow-up    MRI review    HPI [name] is a pleasant 65 year old gentleman who comes in today to discuss MRI results of the lumbar spine.  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  He is status post L4-5 and L5-S1 laminectomy and PLIF by Dr. Vertell Limber.  He was recently seen by Dr. Vertell Limber where this MRI was discussed.  Dr. Vertell Limber made a referral for physical therapy.  He is scheduled to start in the near  future.     Objective: Vital Signs: Ht 6' 1"  (1.854 m)   Wt 189 lb (85.7 kg)   BMI 24.94 kg/m     Ortho Exam unchanged lumbar exam  Specialty Comments:  No specialty comments available.  Imaging: No new imaging   PMFS History: Patient Active Problem List   Diagnosis Date Noted   Right Achilles tendinitis 08/13/2020   Abnormal transaminases 01/21/2020   Diarrhea 01/21/2020   Multiple myeloma in remission ([facility]) 01/21/2020   S/P autologous bone marrow transplantation ([facility]) 01/21/2020   History of total hip replacement 03/03/2017   Unilateral primary osteoarthritis, left hip 09/18/2016   Past Medical History:  Diagnosis Date   Arthritis    Blood dyscrasia    multiples myloma   Blood transfusion without reported diagnosis    Chronic kidney disease    kidney stones   Dental crowns present    loose upper front crown   Diverticulosis    GERD (gastroesophageal reflux disease)    High cholesterol    History of hiatal hernia    History of kidney stones    Multiple myeloma (HCC)    Numbness of foot    right toe    Family History  Problem Relation Age of Onset   Colon cancer Father 68   Pancreatic cancer Father    Colon polyps Father    Heart disease Father  Kidney Stones Father    Heart disease Brother    Heart disease Brother     Past Surgical History:  Procedure Laterality Date   ANTERIOR CRUCIATE LIGAMENT REPAIR Right    BONE MARROW TRANSPLANT  2019   Autologous-multiple myeloma   CHOLECYSTECTOMY  1997   COLONOSCOPY     EXTRACORPOREAL SHOCK WAVE LITHOTRIPSY Right 04/10/2014   Procedure: EXTRACORPOREAL SHOCK WAVE LITHOTRIPSY (ESWL);  Surgeon: Harle Stanford, MD;  Location: [facility];  Service: Urology;  Laterality: Right;   LUMBAR FUSION     fusion   LUMBAR LAMINECTOMY  2008   ROTATOR CUFF REPAIR Bilateral 2001,2007   TOTAL HIP ARTHROPLASTY Left 03/03/2017   Procedure: LEFT TOTAL HIP ARTHROPLASTY ANTERIOR APPROACH;  Surgeon: Leandrew Koyanagi, MD;  Location:  [facility];  Service: Orthopedics;  Laterality: Left;   Social History   Occupational History   Occupation: retired Therapist, sports  Tobacco Use   Smoking status: Former    Packs/day: 1.00    Years: 15.00    Pack years: 15.00    Types: Cigarettes    Quit date: 1990    Years since quitting: 32.5   Smokeless tobacco: Never  Vaping Use   Vaping Use: Never used  Substance and Sexual Activity   Alcohol use: Yes    Comment: 1-2 per week   Drug use: No   Sexual activity: Yes

## 2021-08-26 ENCOUNTER — Ambulatory Visit: Payer: Medicare Other | Admitting: Orthopaedic Surgery

## 2021-12-10 ENCOUNTER — Other Ambulatory Visit: Payer: Self-pay

## 2021-12-10 ENCOUNTER — Encounter: Payer: Self-pay | Admitting: Orthopaedic Surgery

## 2021-12-10 ENCOUNTER — Ambulatory Visit (INDEPENDENT_AMBULATORY_CARE_PROVIDER_SITE_OTHER): Payer: Medicare Other | Admitting: Orthopaedic Surgery

## 2021-12-10 ENCOUNTER — Ambulatory Visit (INDEPENDENT_AMBULATORY_CARE_PROVIDER_SITE_OTHER): Payer: Medicare Other

## 2021-12-10 DIAGNOSIS — M79644 Pain in right finger(s): Secondary | ICD-10-CM

## 2021-12-10 DIAGNOSIS — M1811 Unilateral primary osteoarthritis of first carpometacarpal joint, right hand: Secondary | ICD-10-CM | POA: Diagnosis not present

## 2021-12-10 NOTE — Progress Notes (Signed)
? ?Office Visit Note ?  ?Patient: [name]           ?Date of Birth: September 26, 1956           ?MRN: [id number] ?Visit Date: 12/10/2021 ?             ?Requested by: Moshe Cipro, MD ?[place] [place] ?[place] [id number] ?PCP: Moshe Cipro, MD ? ? ?Assessment & Plan: ?Visit Diagnoses:  ?1. Primary osteoarthritis of first carpometacarpal joint of right hand   ? ? ?Plan: Impression is right thumb CMC arthritis.  Treatment options were discussed and their associated pros and cons.  Based on options he would like to start with a CMC brace and Voltaren gel and limit some of his activity.  He may consider an injection in the future. ? ?Follow-Up Instructions: No follow-ups on file.  ? ?Orders:  ?Orders Placed This Encounter  ?Procedures  ? XR Finger Thumb Right  ? ?No orders of the defined types were placed in this encounter. ? ? ? ? Procedures: ?No procedures performed ? ? ?Clinical Data: ?No additional findings. ? ? ?Subjective: ?Chief Complaint  ?Patient presents with  ? Right Hand - Pain  ? ? ?HPI ? ?[name] comes in today for evaluation of new problem of right thumb pain for several months.  It is worse with use of the right hand.  He has pain when he is pinching nails or grasping things.  He has pain with holding objects and sometimes he drops them due to a sudden sharp pain.  He is able to perform most of his ADLs without problems.  Denies any triggering. ? ?Review of Systems ? ? ?Objective: ?Vital Signs: There were no vitals taken for this visit. ? ?Physical Exam ? ?Ortho Exam ? ?Examination of the right thumb shows pain and slight crepitus with grind test.  He has no tenderness in the flexor tendon.  There is no triggering.  Range of motion of the thumb is well-preserved.  Normal capillary refill. ? ?Specialty Comments:  ?No specialty comments available. ? ?Imaging: ?XR Finger Thumb Right ? ?Result Date: 12/10/2021 ?Advanced CMC arthritis of the thumb.  ? ? ?PMFS History: ?Patient Active Problem List  ?  Diagnosis Date Noted  ? Primary osteoarthritis of first carpometacarpal joint of right hand 12/10/2021  ? Right Achilles tendinitis 08/13/2020  ? Abnormal transaminases 01/21/2020  ? Diarrhea 01/21/2020  ? Multiple myeloma in remission ([facility]) 01/21/2020  ? S/P autologous bone marrow transplantation ([facility]) 01/21/2020  ? History of total hip replacement 03/03/2017  ? Unilateral primary osteoarthritis, left hip 09/18/2016  ? ?Past Medical History:  ?Diagnosis Date  ? Arthritis   ? Blood dyscrasia   ? multiples myloma  ? Blood transfusion without reported diagnosis   ? Chronic kidney disease   ? kidney stones  ? Dental crowns present   ? loose upper front crown  ? Diverticulosis   ? GERD (gastroesophageal reflux disease)   ? High cholesterol   ? History of hiatal hernia   ? History of kidney stones   ? Multiple myeloma ([facility])   ? Numbness of foot   ? right toe  ?  ?Family History  ?Problem Relation Age of Onset  ? Colon cancer Father 74  ? Pancreatic cancer Father   ? Colon polyps Father   ? Heart disease Father   ? Kidney Stones Father   ? Heart disease Brother   ? Heart disease Brother   ?  ?  Past Surgical History:  ?Procedure Laterality Date  ? ANTERIOR CRUCIATE LIGAMENT REPAIR Right   ? BONE MARROW TRANSPLANT  2019  ? Autologous-multiple myeloma  ? CHOLECYSTECTOMY  1997  ? COLONOSCOPY    ? EXTRACORPOREAL SHOCK WAVE LITHOTRIPSY Right 04/10/2014  ? Procedure: EXTRACORPOREAL SHOCK WAVE LITHOTRIPSY (ESWL);  Surgeon: Harle Stanford, MD;  Location: [facility];  Service: Urology;  Laterality: Right;  ? LUMBAR FUSION    ? fusion  ? LUMBAR LAMINECTOMY  2008  ? ROTATOR CUFF REPAIR Bilateral 2001,2007  ? TOTAL HIP ARTHROPLASTY Left 03/03/2017  ? Procedure: LEFT TOTAL HIP ARTHROPLASTY ANTERIOR APPROACH;  Surgeon: Leandrew Koyanagi, MD;  Location: [facility];  Service: Orthopedics;  Laterality: Left;  ? ?Social History  ? ?Occupational History  ? Occupation: retired Therapist, sports  ?Tobacco Use  ? Smoking status: Former  ?  Packs/day: 1.00  ?  Years: 15.00  ?   Pack years: 15.00  ?  Types: Cigarettes  ?  Quit date: 15  ?  Years since quitting: 33.2  ? Smokeless tobacco: Never  ?Vaping Use  ? Vaping Use: Never used  ?Substance and Sexual Activity  ? Alcohol use: Yes  ?  Comment: 1-2 per week  ? Drug use: No  ? Sexual activity: Yes  ? ? ? ? ? ? ?

## 2022-03-14 ENCOUNTER — Encounter: Payer: Self-pay | Admitting: Physician Assistant

## 2022-03-15 ENCOUNTER — Other Ambulatory Visit: Payer: Self-pay | Admitting: Physician Assistant

## 2022-03-15 MED ORDER — METHYLPREDNISOLONE 4 MG PO TBPK: ORAL_TABLET | ORAL | 0 refills | Status: DC

## 2022-04-05 ENCOUNTER — Other Ambulatory Visit: Payer: Self-pay

## 2022-04-05 ENCOUNTER — Encounter: Payer: Self-pay | Admitting: Orthopaedic Surgery

## 2022-04-05 DIAGNOSIS — G8929 Other chronic pain: Secondary | ICD-10-CM

## 2022-04-13 ENCOUNTER — Ambulatory Visit
Admission: RE | Admit: 2022-04-13 | Discharge: 2022-04-13 | Disposition: A | Discharge status: Home / Self Care | Payer: Medicare Other | Source: Ambulatory Visit | Attending: Orthopaedic Surgery | Admitting: Orthopaedic Surgery

## 2022-04-13 DIAGNOSIS — G8929 Other chronic pain: Secondary | ICD-10-CM

## 2022-04-13 MED ORDER — METHYLPREDNISOLONE ACETATE 40 MG/ML INJ SUSP (RADIOLOG: 80.0000 mg | Freq: Once | INTRAMUSCULAR | Status: DC

## 2022-04-13 MED ORDER — IOPAMIDOL (ISOVUE-M 300) INJECTION 61%: 1.0000 mL | Freq: Once | INTRAMUSCULAR | Status: DC | PRN

## 2022-04-13 NOTE — Discharge Instructions (Signed)

## 2022-10-13 ENCOUNTER — Ambulatory Visit (INDEPENDENT_AMBULATORY_CARE_PROVIDER_SITE_OTHER): Payer: Medicare Other

## 2022-10-13 ENCOUNTER — Ambulatory Visit (INDEPENDENT_AMBULATORY_CARE_PROVIDER_SITE_OTHER): Payer: Medicare Other | Admitting: Orthopaedic Surgery

## 2022-10-13 ENCOUNTER — Ambulatory Visit: Payer: Self-pay

## 2022-10-13 ENCOUNTER — Ambulatory Visit (INDEPENDENT_AMBULATORY_CARE_PROVIDER_SITE_OTHER): Payer: Medicare Other | Admitting: Sports Medicine

## 2022-10-13 ENCOUNTER — Encounter: Payer: Self-pay | Admitting: Orthopaedic Surgery

## 2022-10-13 DIAGNOSIS — M25511 Pain in right shoulder: Secondary | ICD-10-CM

## 2022-10-13 DIAGNOSIS — G8929 Other chronic pain: Secondary | ICD-10-CM

## 2022-10-13 MED ORDER — BUPIVACAINE HCL 0.25 % IJ SOLN
2.0000 mL | INTRAMUSCULAR | Status: AC | PRN
  Administered 2022-10-13: 2 mL via INTRA_ARTICULAR

## 2022-10-13 MED ORDER — LIDOCAINE HCL 1 % IJ SOLN
2.0000 mL | INTRAMUSCULAR | Status: AC | PRN
  Administered 2022-10-13: 2 mL

## 2022-10-13 MED ORDER — METHYLPREDNISOLONE ACETATE 40 MG/ML IJ SUSP
80.0000 mg | INTRAMUSCULAR | Status: AC | PRN
  Administered 2022-10-13: 80 mg via INTRA_ARTICULAR

## 2022-10-13 NOTE — Progress Notes (Signed)
   Procedure Note  Patient: [name]             Date of Birth: [birth year]           MRN: [id number]             Visit Date: 10/13/2022  Procedures: Visit Diagnoses:  1. Chronic right shoulder pain    Large Joint Inj: R glenohumeral on 10/13/2022 10:12 AM Indications: pain Details: 22 G 3.5 in needle, ultrasound-guided posterior approach Medications: 2 mL lidocaine 1 %; 2 mL bupivacaine 0.25 %; 80 mg methylPREDNISolone acetate 40 MG/ML Outcome: tolerated well, no immediate complications  US-guided glenohumeral joint injection, right shoulder After discussion on risks/benefits/indications, informed verbal consent was obtained. A timeout was then performed. The patient was positioned lying lateral recumbent on examination table. The patient's shoulder was prepped with betadine and multiple alcohol swabs and utilizing ultrasound guidance, the patient's glenohumeral joint was identified on ultrasound. Using ultrasound guidance a 22-gauge, 3.5 inch needle with a mixture of 2:2:2 cc's lidocaine:bupivicaine:depomedrol was directed from a lateral to medial direction via in-plane technique into the glenohumeral joint with visualization of appropriate spread of injectate into the joint. Patient tolerated the procedure well without immediate complications.      Procedure, treatment alternatives, risks and benefits explained, specific risks discussed. Consent was given by the patient. Immediately prior to procedure a time out was called to verify the correct patient, procedure, equipment, support staff and site/side marked as required. Patient was prepped and draped in the usual sterile fashion.    - I evaluated the patient about 10 minutes post-injection and he had improvement in pain and range of motion - follow-up with Dr.  Erlinda Hong as indicated; I am happy to see them as needed  Elba Barman, DO [place]  This note was dictated  using Dragon naturally speaking software and may contain errors in syntax, spelling, or content which have not been identified prior to signing this note.

## 2022-10-13 NOTE — Progress Notes (Signed)
Office Visit Note   Patient: [name]           Date of Birth: 1956-05-23           MRN: [id number] Visit Date: 10/13/2022              Requested by: Moshe Cipro, MD [address] [place],  [place] [address] PCP: Moshe Cipro, MD   Assessment & Plan: Visit Diagnoses:  1. Acute pain of right shoulder     Plan: Impression is chronic right shoulder pain.  Based on findings and may have partial subscap and supraspinatus tear.  Disease process reviewed and I would like to have him take it easy for about 6 weeks and modify some of his activities.  Recommended a glenohumeral injection which he agreed to today with Dr. Rolena Infante.  He will let me know if his symptoms do not improve  Follow-Up Instructions: No follow-ups on file.   Orders:  Orders Placed This Encounter  Procedures   XR Shoulder Right   No orders of the defined types were placed in this encounter.     Procedures: No procedures performed   Clinical Data: No additional findings.   Subjective: Chief Complaint  Patient presents with   Right Shoulder - Pain    HPI [name] comes in today for evaluation of right shoulder pain for about a month.  Feels some catching pain.  States states he has pain when he is loading wood into his [employer]er.  Denies any injuries.  Has been taking ibuprofen which provides temporary relief. Review of Systems   Objective: Vital Signs: There were no vitals taken for this visit.  Physical Exam  Ortho Exam Examination right shoulder shows pain with bearhug and empty can test.  Range of motion is normal.  AC joint is nontender. Specialty Comments:  No specialty comments available.  Imaging: XR Shoulder Right  Result Date: 10/13/2022 Mild degenerative changes of the shoulder joint and AC joint.  No acute abnormalities.  Small irregularity of the greater tuberosity.    PMFS History: Patient Active Problem List   Diagnosis Date Noted   Primary osteoarthritis of  first carpometacarpal joint of right hand 12/10/2021   Right Achilles tendinitis 08/13/2020   Abnormal transaminases 01/21/2020   Diarrhea 01/21/2020   Multiple myeloma in remission ([facility]) 01/21/2020   S/P autologous bone marrow transplantation ([facility]) 01/21/2020   History of total hip replacement 03/03/2017   Unilateral primary osteoarthritis, left hip 09/18/2016   Past Medical History:  Diagnosis Date   Arthritis    Blood dyscrasia    multiples myloma   Blood transfusion without reported diagnosis    Chronic kidney disease    kidney stones   Dental crowns present    loose upper front crown   Diverticulosis    GERD (gastroesophageal reflux disease)    High cholesterol    History of hiatal hernia    History of kidney stones    Multiple myeloma ([facility])    Numbness of foot    right toe    Family History  Problem Relation Age of Onset   Colon cancer Father 7   Pancreatic cancer Father    Colon polyps Father    Heart disease Father    Kidney Stones Father    Heart disease Brother    Heart disease Brother     Past Surgical History:  Procedure Laterality Date   ANTERIOR CRUCIATE LIGAMENT REPAIR Right    BONE MARROW TRANSPLANT  2019   Autologous-multiple myeloma   CHOLECYSTECTOMY  1997   COLONOSCOPY     EXTRACORPOREAL SHOCK WAVE LITHOTRIPSY Right 04/10/2014   Procedure: EXTRACORPOREAL SHOCK WAVE LITHOTRIPSY (ESWL);  Surgeon: Harle Stanford, MD;  Location: [facility];  Service: Urology;  Laterality: Right;   LUMBAR FUSION     fusion   LUMBAR LAMINECTOMY  2008   ROTATOR CUFF REPAIR Bilateral 2001,2007   TOTAL HIP ARTHROPLASTY Left 03/03/2017   Procedure: LEFT TOTAL HIP ARTHROPLASTY ANTERIOR APPROACH;  Surgeon: Leandrew Koyanagi, MD;  Location: [facility];  Service: Orthopedics;  Laterality: Left;   Social History   Occupational History   Occupation: retired Therapist, sports  Tobacco Use   Smoking status: Former    Packs/day: 1.00    Years: 15.00    Total pack years: 15.00    Types: Cigarettes     Quit date: 1990    Years since quitting: 34.0   Smokeless tobacco: Never  Vaping Use   Vaping Use: Never used  Substance and Sexual Activity   Alcohol use: Yes    Comment: 1-2 per week   Drug use: No   Sexual activity: Yes

## 2022-10-13 NOTE — Addendum Note (Signed)
Addended by: Azucena Cecil on: 10/13/2022 07:04 PM   Modules accepted: Level of Service

## 2023-01-26 ENCOUNTER — Ambulatory Visit (INDEPENDENT_AMBULATORY_CARE_PROVIDER_SITE_OTHER): Payer: Medicare Other | Admitting: Orthopaedic Surgery

## 2023-01-26 ENCOUNTER — Other Ambulatory Visit (INDEPENDENT_AMBULATORY_CARE_PROVIDER_SITE_OTHER): Payer: Medicare Other

## 2023-01-26 DIAGNOSIS — M79641 Pain in right hand: Secondary | ICD-10-CM

## 2023-01-26 MED ORDER — PREDNISONE 10 MG (21) PO TBPK: ORAL_TABLET | ORAL | 3 refills | Status: DC

## 2023-01-26 NOTE — Progress Notes (Signed)
Office Visit Note   Patient: [name]           Date of Birth: [birth year]           MRN: [id number] Visit Date: 01/26/2023              Requested by: Romeo Rabon, MD [address] [place],  [place] [address] PCP: Romeo Rabon, MD   Assessment & Plan: Visit Diagnoses:  1. Pain in right hand     Plan: Impression is OA flare of right index MCP joint and stable right thumb CMC OA.  Will provided new CMC brace and prescribe steroid dose pack for index finger OA flare.  Activity modification as needed.  Follow up as needed.  Follow-Up Instructions: No follow-ups on file.   Orders:  Orders Placed This Encounter  Procedures   XR Hand Complete Right   Meds ordered this encounter  Medications   predniSONE (STERAPRED UNI-PAK 21 TAB) 10 MG (21) TBPK tablet    Sig: Take as directed    Dispense:  21 tablet    Refill:  3      Procedures: No procedures performed   Clinical Data: No additional findings.   Subjective: Chief Complaint  Patient presents with   Right Hand - Pain    HPI  [name] comes in for follow up on right thumb CMC OA that is bone on bone.  This is stable.  Needs a new CMC brace.  His main complaint is pain and swelling of the index finger MCP joint.  Denies trauma or changes in activity.    Review of Systems   Objective: Vital Signs: There were no vitals taken for this visit.  Physical Exam  Ortho Exam  Right hand exam - TTP to index MCP joint - mild swelling over the dorsum of the hand - no signs of infection - composite fist with moderate pain  Specialty Comments:  No specialty comments available.  Imaging: XR Hand Complete Right  Result Date: 01/26/2023 Mild OA of index finger MCP joint with a small osteophyte.  Bone on bone thumb CMC DJD    PMFS History: Patient Active Problem List   Diagnosis Date Noted   Primary osteoarthritis of first carpometacarpal joint of right hand 12/10/2021   Right Achilles tendinitis  08/13/2020   Abnormal transaminases 01/21/2020   Diarrhea 01/21/2020   Multiple myeloma in remission 01/21/2020   S/P autologous bone marrow transplantation 01/21/2020   History of total hip replacement 03/03/2017   Unilateral primary osteoarthritis, left hip 09/18/2016   Past Medical History:  Diagnosis Date   Arthritis    Blood dyscrasia    multiples myloma   Blood transfusion without reported diagnosis    Chronic kidney disease    kidney stones   Dental crowns present    loose upper front crown   Diverticulosis    GERD (gastroesophageal reflux disease)    High cholesterol    History of hiatal hernia    History of kidney stones    Multiple myeloma ([facility])    Numbness of foot    right toe    Family History  Problem Relation Age of Onset   Colon cancer Father 55   Pancreatic cancer Father    Colon polyps Father    Heart disease Father    Kidney Stones Father    Heart disease Brother    Heart disease Brother     Past Surgical History:  Procedure Laterality Date  ANTERIOR CRUCIATE LIGAMENT REPAIR Right    BONE MARROW TRANSPLANT  2019   Autologous-multiple myeloma   CHOLECYSTECTOMY  1997   COLONOSCOPY     EXTRACORPOREAL SHOCK WAVE LITHOTRIPSY Right 04/10/2014   Procedure: EXTRACORPOREAL SHOCK WAVE LITHOTRIPSY (ESWL);  Surgeon: Andi Hence, MD;  Location: [facility];  Service: Urology;  Laterality: Right;   LUMBAR FUSION     fusion   LUMBAR LAMINECTOMY  2008   ROTATOR CUFF REPAIR Bilateral 2001,2007   TOTAL HIP ARTHROPLASTY Left 03/03/2017   Procedure: LEFT TOTAL HIP ARTHROPLASTY ANTERIOR APPROACH;  Surgeon: Tarry Kos, MD;  Location: [facility];  Service: Orthopedics;  Laterality: Left;   Social History   Occupational History   Occupation: retired Charity fundraiser  Tobacco Use   Smoking status: Former    Packs/day: 1.00    Years: 15.00    Additional pack years: 0.00    Total pack years: 15.00    Types: Cigarettes    Quit date: 1990    Years since quitting: 34.3   Smokeless  tobacco: Never  Vaping Use   Vaping Use: Never used  Substance and Sexual Activity   Alcohol use: Yes    Comment: 1-2 per week   Drug use: No   Sexual activity: Yes

## 2023-04-30 IMAGING — MR MR LUMBAR SPINE W/O CM
5 series · 42 of 48 positions shown · non-contrast
Comparison: Radiographs 03/17/2021.  MRI 10/11/2014.

CLINICAL DATA: Low back pain, worse on the right, with bilateral
foot numbness for 4 weeks. No known injury. Remote back surgery.
History of multiple myeloma.

EXAM:
MRI LUMBAR SPINE WITHOUT CONTRAST
TECHNIQUE: Multiplanar, multisequence MR imaging of the lumbar spine was
performed. No intravenous contrast was administered.

[Series 3: T1 · sagittal · 4.0mm · 0.88mm/px · 6 of 16 slices shown (1 of 2)]
[im 1/16]
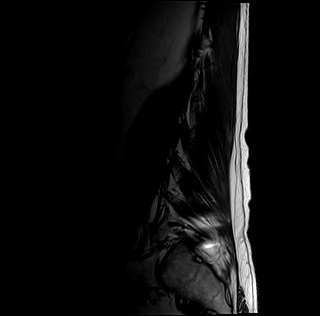
[im 4/16]
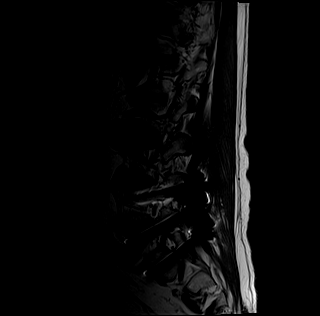
[im 7/16]
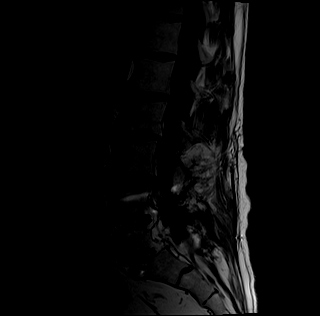
[im 10/16]
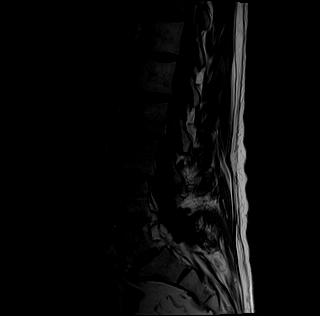
[im 13/16]
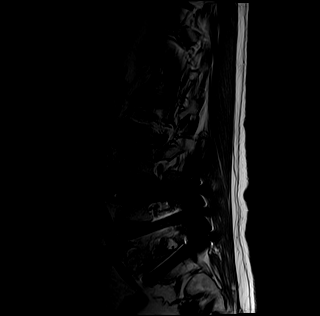
[im 16/16]
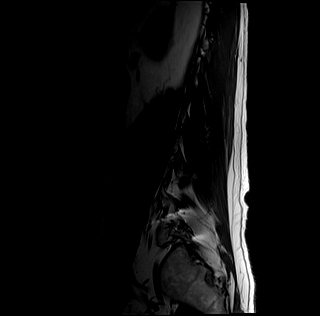

[Series 4: T2 · sagittal · 4.0mm · 0.88mm/px · 6 of 16 slices shown (1 of 2)]
[im 1/16]
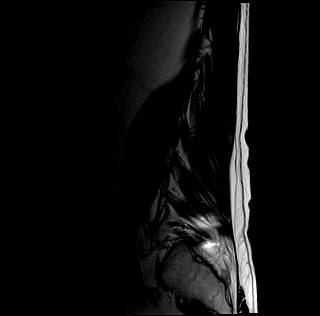
[im 4/16]
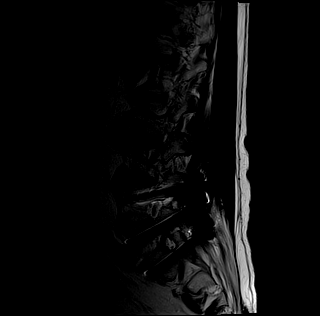
[im 7/16]
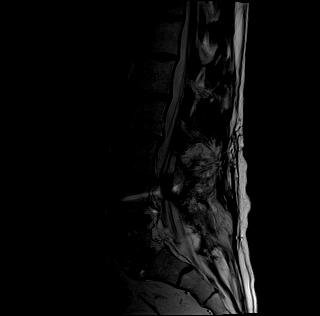
[im 10/16]
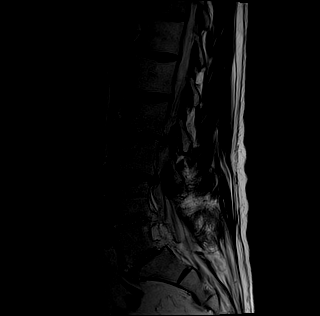
[im 13/16]
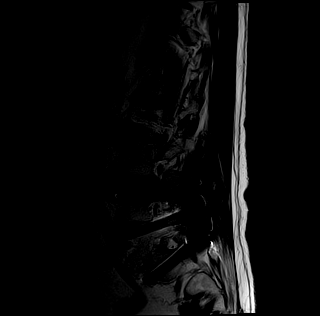
[im 16/16]
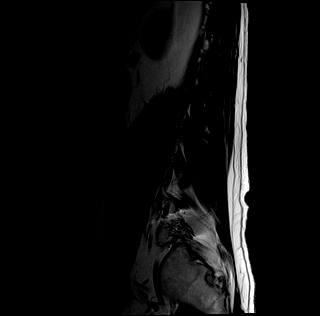

[Series 5: tirm sag · sagittal · 4.0mm · 0.55mm/px · 6 of 16 slices shown]
[im 1/16]
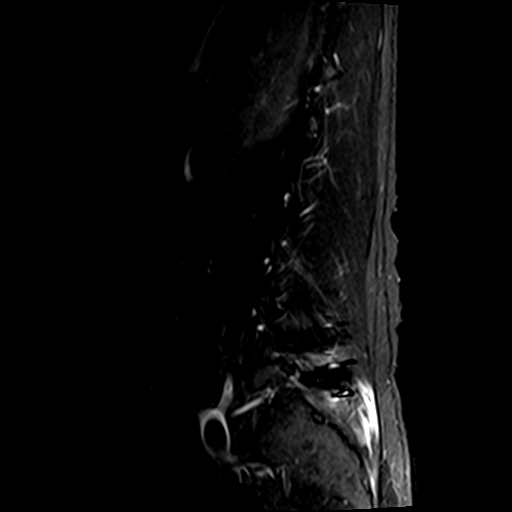
[im 4/16]
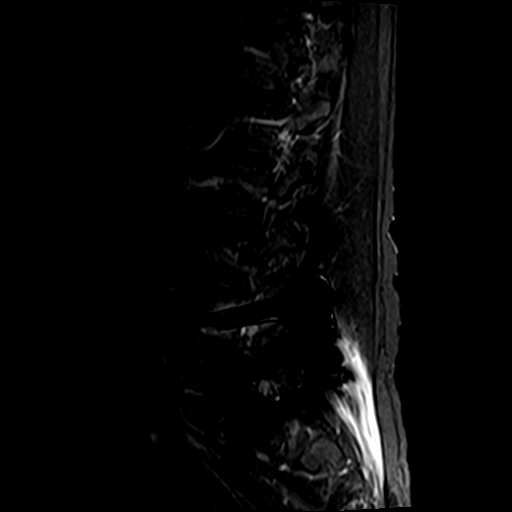
[im 7/16]
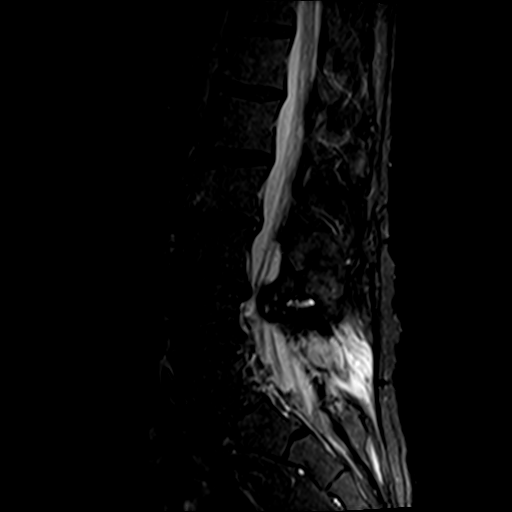
[im 10/16]
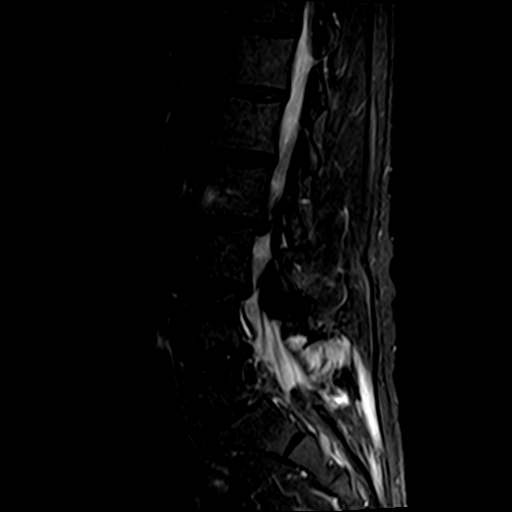
[im 13/16]
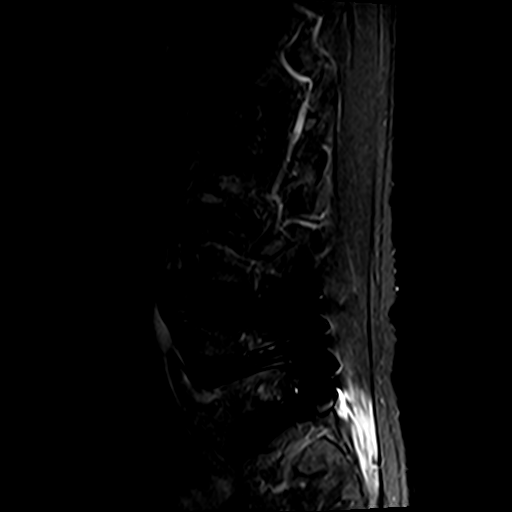
[im 16/16]
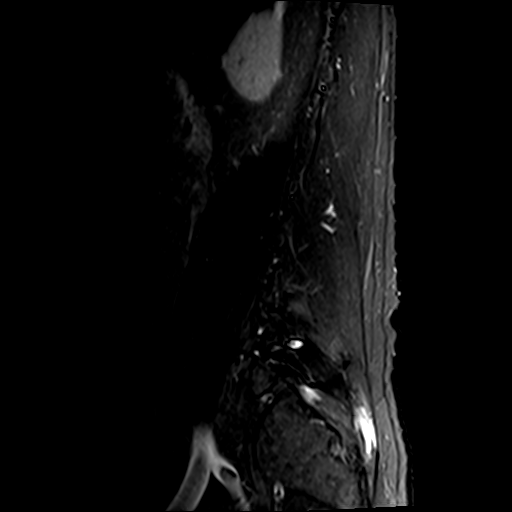

[Series 6: T1 · axial · 4.0mm · 0.70mm/px · z∈[-119,+107]mm · 9 of 42 slices shown (2 of 2)]
[im 1/42]
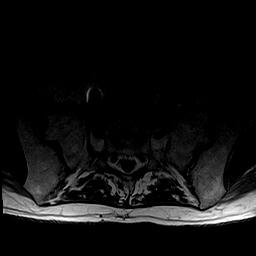
[im 6/42]
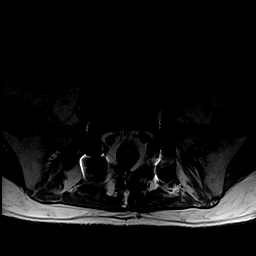
[im 12/42]
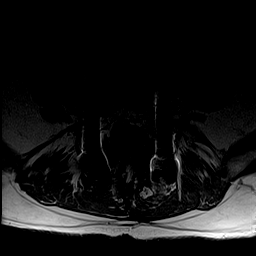
[im 18/42]
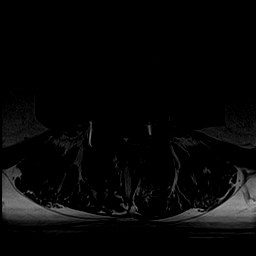
[im 21/42]
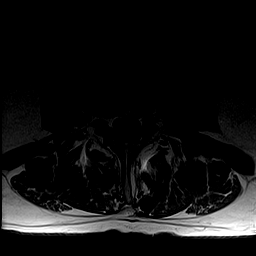
[im 24/42]
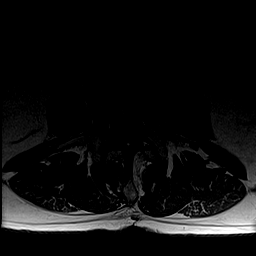
[im 30/42]
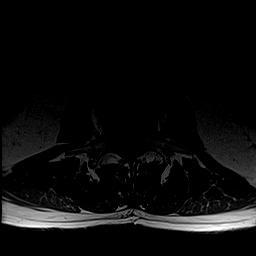
[im 36/42]
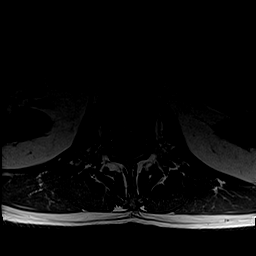
[im 42/42]
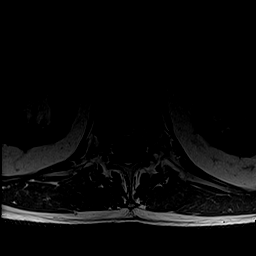

[Series 7: T2 · axial · 4.0mm · 0.70mm/px · z∈[-119,+107]mm · 15 of 42 slices shown (2 of 2)]
[im 1/42]
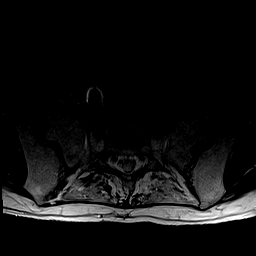
[im 3/42]
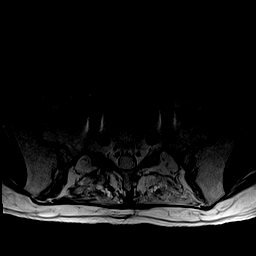
[im 6/42]
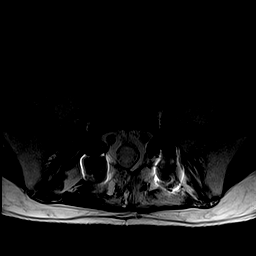
[im 9/42]
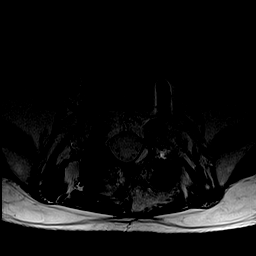
[im 12/42]
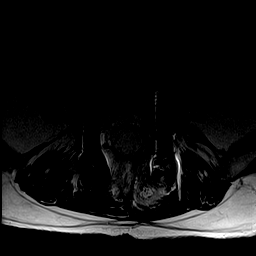
[im 15/42]
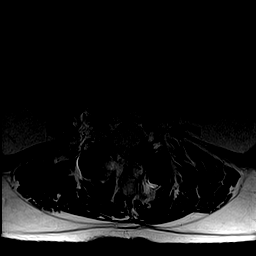
[im 18/42]
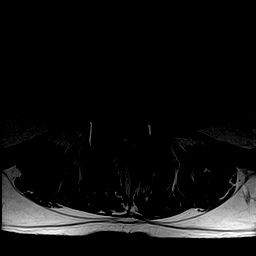
[im 21/42]
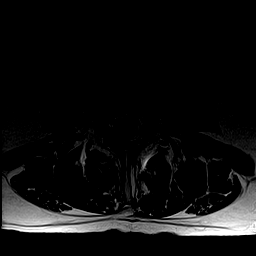
[im 24/42]
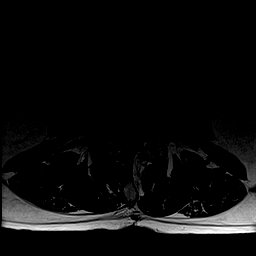
[im 27/42]
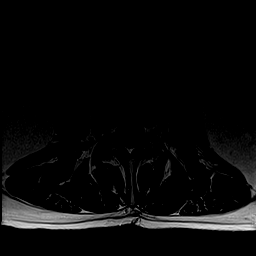
[im 30/42]
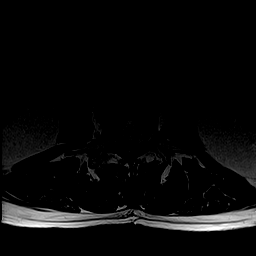
[im 33/42]
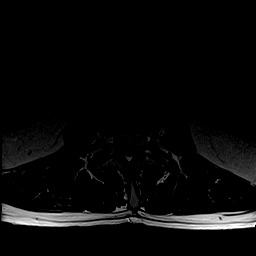
[im 36/42]
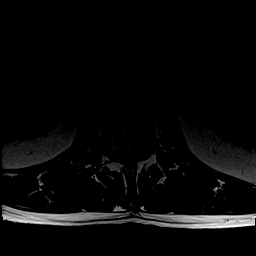
[im 39/42]
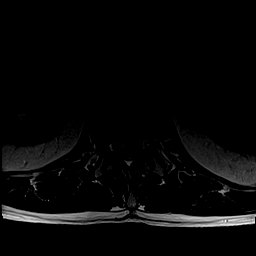
[im 42/42]
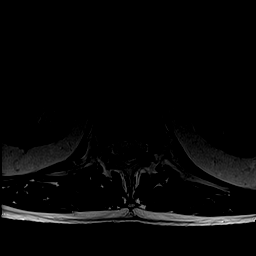

[42 of 48 positions shown; findings below may reference images not displayed]

FINDINGS: Segmentation: Conventional anatomy assumed, with the last open disc
space designated L5-S1.Concordant with previous imaging.

Alignment:  Stable chronic degenerative anterolisthesis at L5-S1.

Vertebrae: No worrisome osseous lesion, acute fracture or pars
defect. Previous laminectomy and PLIF from L4 through S1 with
evidence of solid interbody fusion at both levels. Chronic endplate
degenerative changes elsewhere in the lumbar spine, progressive at
L3-4. Mild sacroiliac degenerative changes bilaterally.

Conus medullaris: Extends to the L1 level and appears normal.

Paraspinal and other soft tissues: No significant paraspinal
findings.

Disc levels:

L1-2: Stable mild disc bulging with endplate osteophyte formation.
No spinal stenosis or nerve root encroachment.

L2-3: Progressive loss of disc height with annular disc bulging and
endplate osteophyte formation asymmetric to the right. There is
mildly progressive asymmetric narrowing of the right lateral recess
and right foramen. Mild facet and ligamentous hypertrophy.

L3-4: Stable loss of disc height with annular disc bulging and
endplate osteophytes asymmetric to the left. Mild facet and
ligamentous hypertrophy. Stable mild spinal stenosis without nerve
root encroachment.

L4-5: The spinal canal and neural foramina are widely decompressed
status post laminectomy and PLIF. No nerve root encroachment.

L5-S1: The spinal canal and neural foramina are well-decompressed
status post laminectomy and PLIF. Stable grade 1 anterolisthesis
without nerve root encroachment.
IMPRESSION: 1. Compared with the remote MRI from 7645, there is mildly
progressive disc degeneration at L2-3 with annular disc bulging and
endplate osteophyte formation asymmetric to the right. Right lateral
recess and right foraminal narrowing has mildly progressed with
potential right L2 nerve root encroachment.
2. Stable mild multifactorial spinal stenosis at L3-4.
3. Stable postsurgical changes at L4-5 and L5-S1 status post
laminectomy and PLIF.

## 2023-05-04 ENCOUNTER — Other Ambulatory Visit: Payer: Self-pay | Admitting: Physician Assistant

## 2023-05-04 ENCOUNTER — Ambulatory Visit (INDEPENDENT_AMBULATORY_CARE_PROVIDER_SITE_OTHER): Payer: Medicare Other | Admitting: Orthopaedic Surgery

## 2023-05-04 ENCOUNTER — Encounter: Payer: Self-pay | Admitting: Orthopaedic Surgery

## 2023-05-04 ENCOUNTER — Other Ambulatory Visit (INDEPENDENT_AMBULATORY_CARE_PROVIDER_SITE_OTHER): Payer: Medicare Other

## 2023-05-04 DIAGNOSIS — M542 Cervicalgia: Secondary | ICD-10-CM | POA: Diagnosis not present

## 2023-05-04 MED ORDER — PREDNISONE 10 MG (21) PO TBPK: ORAL_TABLET | ORAL | 0 refills | Status: DC

## 2023-05-04 MED ORDER — CYCLOBENZAPRINE HCL 5 MG PO TABS: 5.0000 mg | ORAL_TABLET | Freq: Every evening | ORAL | 3 refills | Status: DC | PRN

## 2023-05-04 NOTE — Progress Notes (Signed)
Office Visit Note   Patient: [name]           Date of Birth: 01-24-1956           MRN: [id number] Visit Date: 05/04/2023              Requested by: Romeo Rabon, MD [address] [place],  [place] [address] PCP: Romeo Rabon, MD   Assessment & Plan: Visit Diagnoses:  1. Neck pain     Plan: Impression is right-sided neck pain likely from underlying degenerative changes.  We have discussed various treatment options to include steroid taper and physical therapy.  If his symptoms do not improve, he will let us know we will obtain an MRI to assess for structural abnormalities.  Follow-Up Instructions: Return if symptoms worsen or fail to improve.   Orders:  Orders Placed This Encounter  Procedures   XR Cervical Spine 2 or 3 views   Meds ordered this encounter  Medications   cyclobenzaprine (FLEXERIL) 5 MG tablet    Sig: Take 1-2 tablets (5-10 mg total) by mouth at bedtime as needed for muscle spasms.    Dispense:  30 tablet    Refill:  3      Procedures: No procedures performed   Clinical Data: No additional findings.   Subjective: Chief Complaint  Patient presents with   Neck - Pain    HPI patient is a pleasant 67 year old gentleman who comes in today with right-sided neck pain for the past 3 days.  He denies any injury or change in activity.  He notes this has happened about 4 times within the past year.  Symptoms have improved in the past with Robaxin, heat and rest.  He does note today that his current symptoms are little bit better.  He denies any pain or weakness to the right upper extremity.  He does have paresthesias to all 5 fingertips but notes this has been ongoing for a while as he was on a chemo drug which caused neuropathy.  His current symptoms are worse with certain movements of the neck especially right-sided rotation.  Ibuprofen and Robaxin seem to help.  Review of Systems as detailed in HPI.  All others reviewed and are  negative.   Objective: Vital Signs: There were no vitals taken for this visit.  Physical Exam well-developed well-nourished gentleman in no acute distress.  Alert and oriented x 3.  Ortho Exam cervical spine exam reveals slightly limited flexion and extension.  He has increased pain with right-sided rotation.  No spinous tenderness.  He has mild tenderness to the right-sided paraspinous musculature.  No focal weakness.  He is neurovascularly intact distally.  Specialty Comments:  No specialty comments available.  Imaging: XR Cervical Spine 2 or 3 views  Result Date: 05/04/2023 X-rays demonstrate advanced multilevel degenerative changes    PMFS History: Patient Active Problem List   Diagnosis Date Noted   Primary osteoarthritis of first carpometacarpal joint of right hand 12/10/2021   Right Achilles tendinitis 08/13/2020   Abnormal transaminases 01/21/2020   Diarrhea 01/21/2020   Multiple myeloma in remission ([facility]) 01/21/2020   S/P autologous bone marrow transplantation ([facility]) 01/21/2020   History of total hip replacement 03/03/2017   Unilateral primary osteoarthritis, left hip 09/18/2016   Past Medical History:  Diagnosis Date   Arthritis    Blood dyscrasia    multiples myloma   Blood transfusion without reported diagnosis    Chronic kidney disease    kidney stones  Dental crowns present    loose upper front crown   Diverticulosis    GERD (gastroesophageal reflux disease)    High cholesterol    History of hiatal hernia    History of kidney stones    Multiple myeloma ([facility])    Numbness of foot    right toe    Family History  Problem Relation Age of Onset   Colon cancer Father 14   Pancreatic cancer Father    Colon polyps Father    Heart disease Father    Kidney Stones Father    Heart disease Brother    Heart disease Brother     Past Surgical History:  Procedure Laterality Date   ANTERIOR CRUCIATE LIGAMENT REPAIR Right    BONE MARROW TRANSPLANT  2019    Autologous-multiple myeloma   CHOLECYSTECTOMY  1997   COLONOSCOPY     EXTRACORPOREAL SHOCK WAVE LITHOTRIPSY Right 04/10/2014   Procedure: EXTRACORPOREAL SHOCK WAVE LITHOTRIPSY (ESWL);  Surgeon: Andi Hence, MD;  Location: [facility];  Service: Urology;  Laterality: Right;   LUMBAR FUSION     fusion   LUMBAR LAMINECTOMY  2008   ROTATOR CUFF REPAIR Bilateral 2001,2007   TOTAL HIP ARTHROPLASTY Left 03/03/2017   Procedure: LEFT TOTAL HIP ARTHROPLASTY ANTERIOR APPROACH;  Surgeon: Tarry Kos, MD;  Location[facility];  Service: Orthopedics;  Laterality: Left;   Social History   Occupational History   Occupation: retired Char[employer] fundraiser  Tobacco Use   Smoking status: Former    Current packs/day: 0.00    Average packs/day: 1 pack/day for 15.0 years (15.0 ttl pk-yrs)    Types: Cigarettes    Start date: 27    Quit date: 1990    Years since quitting: 34.6   Smokeless tobacco: Never  Vaping Use   Vaping status: Never Used  Substance and Sexual Activity   Alcohol use: Yes    Comment: 1-2 per week   Drug use: No   Sexual activity: Yes

## 2023-05-18 ENCOUNTER — Encounter: Payer: Self-pay | Admitting: Orthopaedic Surgery

## 2023-09-05 ENCOUNTER — Other Ambulatory Visit: Payer: Self-pay | Admitting: Orthopaedic Surgery

## 2023-09-05 MED ORDER — PREDNISONE 10 MG (21) PO TBPK: ORAL_TABLET | ORAL | 0 refills | Status: DC

## 2023-09-05 MED ORDER — CYCLOBENZAPRINE HCL 5 MG PO TABS: 5.0000 mg | ORAL_TABLET | Freq: Every evening | ORAL | 3 refills | Status: AC | PRN

## 2023-09-12 ENCOUNTER — Other Ambulatory Visit: Payer: Self-pay

## 2023-09-12 ENCOUNTER — Encounter: Payer: Self-pay | Admitting: Orthopaedic Surgery

## 2023-09-12 DIAGNOSIS — M545 Low back pain, unspecified: Secondary | ICD-10-CM

## 2023-09-12 NOTE — Telephone Encounter (Signed)
Please order Lsp MRI at [facility] imaging.  Thanks.

## 2023-09-23 ENCOUNTER — Encounter: Payer: Self-pay | Admitting: Orthopaedic Surgery

## 2023-09-26 ENCOUNTER — Ambulatory Visit: Payer: Medicare Other | Admitting: Physician Assistant

## 2023-09-26 ENCOUNTER — Other Ambulatory Visit (INDEPENDENT_AMBULATORY_CARE_PROVIDER_SITE_OTHER): Payer: Self-pay

## 2023-09-26 ENCOUNTER — Encounter: Payer: Self-pay | Admitting: Physician Assistant

## 2023-09-26 DIAGNOSIS — M79632 Pain in left forearm: Secondary | ICD-10-CM

## 2023-09-26 NOTE — Progress Notes (Signed)
Office Visit Note   Patient: [name]           Date of Birth: Dec 17, 1955           MRN: [id number] Visit Date: 09/26/2023              Requested by: Romeo Rabon, MD [address] [place],  [place] [address] PCP: Romeo Rabon, MD  Left arm pain    HPI: Patient is an ambidextrous 67 year old gentleman who is 4 days status post having a tree fall down onto his left forearm.  At the time he had quite a bit of swelling and some pain.  Never had any injury before denies any paresthesias or weakness.  Denies any pain in his elbow or wrist.  He does have pain in his thumbs which she says is baseline for him.  He admits is gotten much better since Thursday still has some bruising and pain in the mid forearm  Assessment & Plan: Visit Diagnoses:  1. Left forearm pain     Plan: Contusion left forearm do not see any evidence of fracture on x-ray.  He is neurovascularly intact has good grip strength no pain with supination pronation extension or flexion of his wrist or elbow.  Grip strength is intact may treat this symptomatically follow-up if he does not continue to improve  Follow-Up Instructions: Return if symptoms worsen or fail to improve.   Ortho Exam  Patient is alert, oriented, no adenopathy, well-dressed, normal affect, normal respiratory effort. Examination of his left forearm he has some bruising but his compartments are soft no evidence of any laceration or cellulitis.  Full flexion extension of his wrist and elbow without pain.  Good supination and pronation.  Strength is intact good grip strength sensation is intact pulses are intact  Imaging: XR Forearm Left Result Date: 09/26/2023 Radiographs of his forearm demonstrate no evidence of any fracture well-maintained alignment he does have arthritis of the first [facility] joint  No images are attached to the encounter.  Labs: Lab Results  Component Value Date   ESRSEDRATE 12 02/25/2017   CRP <0.8 02/25/2017      Lab Results  Component Value Date   ALBUMIN 4.4 01/21/2020   ALBUMIN 4.3 02/25/2017    No results found for: "MG" No results found for: "VD25OH"  No results found for: "PREALBUMIN"    Latest Ref Rng & Units 03/04/2017    7:50 AM 02/25/2017   11:59 AM 12/01/2012    2:05 PM  CBC EXTENDED  WBC 4.0 - 10.5 K/uL 4.9  2.9  4.2   RBC 4.22 - 5.81 MIL/uL 3.86  4.25  4.63   Hemoglobin 13.0 - 17.0 g/dL 01.0  27.2  53.6   HCT 39.0 - 52.0 % 38.3  41.5  44.3   Platelets 150 - 400 K/uL 128  184  163   NEUT# 1.7 - 7.7 K/uL  1.7    Lymph# 0.7 - 4.0 K/uL  0.6       There is no height or weight on file to calculate BMI.  Orders:  Orders Placed This Encounter  Procedures   XR Forearm Left   No orders of the defined types were placed in this encounter.    Procedures: No procedures performed  Clinical Data: No additional findings.  ROS:  All other systems negative, except as noted in the HPI. Review of Systems  Objective: Vital Signs: There were no vitals taken for this visit.  Specialty  Comments:  No specialty comments available.  PMFS History: Patient Active Problem List   Diagnosis Date Noted   Primary osteoarthritis of first carpometacarpal joint of right hand 12/10/2021   Right Achilles tendinitis 08/13/2020   Abnormal transaminases 01/21/2020   Diarrhea 01/21/2020   Multiple myeloma in remission (HCC) 01/21/2020   S/P autologous bone marrow transplantation (HCC) 01/21/2020   History of total hip replacement 03/03/2017   Unilateral primary osteoarthritis, left hip 09/18/2016   Past Medical History:  Diagnosis Date   Arthritis    Blood dyscrasia    multiples myloma   Blood transfusion without reported diagnosis    Chronic kidney disease    kidney stones   Dental crowns present    loose upper front crown   Diverticulosis    GERD (gastroesophageal reflux disease)    High cholesterol    History of hiatal hernia    History of kidney stones    Multiple  myeloma (HCC)    Numbness of foot    right toe    Family History  Problem Relation Age of Onset   Colon cancer Father 34   Pancreatic cancer Father    Colon polyps Father    Heart disease Father    Kidney Stones Father    Heart disease Brother    Heart disease Brother     Past Surgical History:  Procedure Laterality Date   ANTERIOR CRUCIATE LIGAMENT REPAIR Right    BONE MARROW TRANSPLANT  2019   Autologous-multiple myeloma   CHOLECYSTECTOMY  1997   COLONOSCOPY     EXTRACORPOREAL SHOCK WAVE LITHOTRIPSY Right 04/10/2014   Procedure: EXTRACORPOREAL SHOCK WAVE LITHOTRIPSY (ESWL);  Surgeon: Andi Hence, MD;  Location: [facility];  Service: Urology;  Laterality: Right;   LUMBAR FUSION     fusion   LUMBAR LAMINECTOMY  2008   ROTATOR CUFF REPAIR Bilateral 2001,2007   TOTAL HIP ARTHROPLASTY Left 03/03/2017   Procedure: LEFT TOTAL HIP ARTHROPLASTY ANTERIOR APPROACH;  Surgeon: Tarry Kos, MD;  Location: [facility];  Service: Orthopedics;  Laterality: Left;   Social History   Occupational History   Occupation: retired [employer]  Tobacco Use   Smoking status: Former    Current packs/day: 0.00    Average packs/day: 1 pack/day for 15.0 years (15.0 ttl pk-yrs)    Types: Cigarettes    Start date: 32    Quit date: 1990    Years since quitting: 35.0   Smokeless tobacco: Never  Vaping Use   Vaping status: Never Used  Substance and Sexual Activity   Alcohol use: Yes    Comment: 1-2 per week   Drug use: No   Sexual activity: Yes

## 2023-10-04 ENCOUNTER — Ambulatory Visit
Admission: RE | Admit: 2023-10-04 | Discharge: 2023-10-04 | Disposition: A | Discharge status: Home / Self Care | Payer: Medicare Other | Source: Ambulatory Visit | Attending: Orthopaedic Surgery | Admitting: Orthopaedic Surgery

## 2023-10-04 DIAGNOSIS — M545 Low back pain, unspecified: Secondary | ICD-10-CM

## 2023-10-19 ENCOUNTER — Encounter: Payer: Self-pay | Admitting: Orthopaedic Surgery

## 2023-10-19 ENCOUNTER — Other Ambulatory Visit: Payer: Self-pay

## 2023-10-19 DIAGNOSIS — M545 Low back pain, unspecified: Secondary | ICD-10-CM

## 2023-10-19 NOTE — Telephone Encounter (Signed)
 Please refer for ESI.  [facility] or [facility].  Thanks.

## 2023-11-09 ENCOUNTER — Other Ambulatory Visit: Payer: Self-pay

## 2023-11-09 ENCOUNTER — Ambulatory Visit: Payer: Medicare Other | Admitting: Physical Medicine and Rehabilitation

## 2023-11-09 DIAGNOSIS — Z981 Arthrodesis status: Secondary | ICD-10-CM | POA: Diagnosis not present

## 2023-11-09 DIAGNOSIS — M5416 Radiculopathy, lumbar region: Secondary | ICD-10-CM

## 2023-11-09 DIAGNOSIS — M5116 Intervertebral disc disorders with radiculopathy, lumbar region: Secondary | ICD-10-CM | POA: Diagnosis not present

## 2023-11-09 MED ORDER — METHYLPREDNISOLONE ACETATE 40 MG/ML IJ SUSP
40.0000 mg | Freq: Once | INTRAMUSCULAR | Status: AC
  Administered 2023-11-09: 40 mg

## 2023-11-09 NOTE — Progress Notes (Signed)
 Functional Pain Scale - descriptive words and definitions  Uncomfortable (3)  Pain is present but can complete all ADL's/sleep is slightly affected and passive distraction only gives marginal relief. Mild range order  Average Pain 3  L>R +Driver, -BT, -Dye Allergies.

## 2023-11-26 ENCOUNTER — Encounter: Payer: Self-pay | Admitting: Physical Medicine and Rehabilitation

## 2023-11-26 NOTE — Progress Notes (Signed)
 [name] - 68 y.o. male MRN [id number]  Date of birth: 06/18/1956  Office Visit Note: Visit Date: 11/09/2023 PCP: Milana Sharper, MD Referred by: Milana Sharper, MD  Subjective: Chief Complaint  Patient presents with   Lower Back - Pain   HPI: [name] is a 68 y.o. male who comes in today for evaluation and management at the request of Dr. Sharper Cummins for chronic low back and right hip and thigh pain in the setting of prior lumbar fusion and multiple orthopedic complaints monitored by Dr. Sharper Cummins in the past.  The patient has had a prior lumbar epidural injection at [facility] imaging in 2023.  This was labeled as mL 3-4 epidural injection but to my review of the imaging is really performed at L2-3.  He reports that this helped quite a bit back then he feels like the pain is very similar.  Dr. Cummins has been seeing him more recently and he has had a flareup over the last several months without specific injury.  He has had no bowel or bladder dysfunction or red flag complaints.  He does have a history of multiple myeloma in remission.  New MRI was performed and this is reviewed with him today and reviewed in the chart below.  He has some mild adjacent level disease above the fusion but no high-grade central stenosis.  He is not really having pain over the buttocks with really a negative Fortin finger sign at this point but symptoms are somewhat vague.  He does get some referral pattern worse with standing and ambulating worse with activity.  Has not responded to medications and time and home exercise program.     Review of Systems  Musculoskeletal:  Positive for back pain and joint pain.  Neurological:  Positive for tingling and weakness.  All other systems reviewed and are negative.  Otherwise per HPI.  Assessment & Plan: Visit Diagnoses:    ICD-10-CM   1. Lumbar radiculopathy  M54.16 XR C-ARM NO REPORT    Epidural Steroid injection    methylPREDNISolone  acetate  (DEPO-MEDROL ) injection 40 mg    2. S/P lumbar fusion  Z98.1 methylPREDNISolone  acetate (DEPO-MEDROL ) injection 40 mg    3. Radiculopathy due to lumbar intervertebral disc disorder  M51.16 methylPREDNISolone  acetate (DEPO-MEDROL ) injection 40 mg       Plan: Findings:  Chronic pain syndrome and postlaminectomy syndrome status post lumbar fusion from L4 to sacrum with some adjacent level disease above the fusion but no high-grade stenosis.  Patient has failed conservative care and has worsening progressive severe symptoms on the right side with some referral consistent with a radiculopathy radiculitis but cannot rule out facet joint pain above the fusion.  Seems a little bit higher than sacroiliac joint although have to at least entertain that I distance she is fused to the sacrum.  We are going to complete a right L2-3 interlaminar injection today.  This was the same level performed in 2023 by [facility] imaging that was mislabeled as L3-4 at that point.    Meds & Orders:  Meds ordered this encounter  Medications   methylPREDNISolone  acetate (DEPO-MEDROL ) injection 40 mg    Orders Placed This Encounter  Procedures   XR C-ARM NO REPORT   Epidural Steroid injection    Follow-up: Return if symptoms worsen or fail to improve.   Procedures: No procedures performed  Lumbar Epidural Steroid Injection - Interlaminar Approach with Fluoroscopic Guidance  Patient: [name]  Date of Birth: January 10, 1956 MRN: [id number] PCP: Milana Sharper, MD      Visit Date: 11/09/2023   Universal Protocol:     Consent Given By: the patient  Position: PRONE  Additional Comments: Vital signs were monitored before and after the procedure. Patient was prepped and draped in the usual sterile fashion. The correct patient, procedure, and site was verified.   Injection Procedure Details:   Procedure diagnoses: Lumbar radiculopathy [M54.16]   Meds Administered:  Meds ordered this encounter   Medications   methylPREDNISolone  acetate (DEPO-MEDROL ) injection 40 mg     Laterality: Right  Location/Site:  L2-3, this is the same level that was performed in 2023 3 degrees.  Imaging although they labeled at L3-4.  Needle: 3.5 in., 20 ga. Tuohy  Needle Placement: Paramedian epidural  Findings:   -Comments: Excellent flow of contrast into the epidural space.  Procedure Details: Using a paramedian approach from the side mentioned above, the region overlying the inferior lamina was localized under fluoroscopic visualization and the soft tissues overlying this structure were infiltrated with 4 ml. of 1% Lidocaine  without Epinephrine . The Tuohy needle was inserted into the epidural space using a paramedian approach.   The epidural space was localized using loss of resistance along with counter oblique bi-planar fluoroscopic views.  After negative aspirate for air, blood, and CSF, a 2 ml. volume of Isovue -250 was injected into the epidural space and the flow of contrast was observed. Radiographs were obtained for documentation purposes.    The injectate was administered into the level noted above.   Additional Comments:  No complications occurred Dressing: 2 x 2 sterile gauze and Band-Aid    Post-procedure details: Patient was observed during the procedure. Post-procedure instructions were reviewed.  Patient left the clinic in stable condition.   Clinical History: MRI LUMBAR SPINE WITHOUT CONTRAST   TECHNIQUE: Multiplanar, multisequence MR imaging of the lumbar spine was performed. No intravenous contrast was administered.   COMPARISON:  Plain films lumbar spine 04/13/2021. MRI lumbar spine 04/07/2021.   FINDINGS: Segmentation:  Standard.   Alignment: Trace retrolisthesis L2 on L3 and L3 on L4. Grade 1 anterolisthesis L5 on S1. Alignment is unchanged.   Vertebrae: No fracture, evidence of discitis, or bone lesion. Status post L4-S1 fusion as seen on the prior MRI.    Conus medullaris and cauda equina: Conus extends to the L1 level. Conus and cauda equina appear normal.   Paraspinal and other soft tissues: Parapelvic renal cysts are again seen.   Disc levels:   T11-12 is imaged in the sagittal plane only. Minimal disc bulging without stenosis is present.   T12-L1: Minimal disc bulge without stenosis.   L1-2: Mild facet arthropathy and minimal disc bulge.  No stenosis.   L2-3: There is a shallow disc bulge. Protrusion in the inferior aspect of the right foramen is again seen. Mild right subarticular recess and foraminal narrowing is unchanged. The left foramen is open.   L3-4: Shallow disc bulge and endplate spur. Mild central canal narrowing is again seen. The foramina are open.   L4-5: Status post fusion.  No stenosis.   L5-S1: Status post fusion.  No stenosis.   IMPRESSION: 1. No change in the appearance of the lumbar spine since the prior MRI. 2. Status post L4-S1 fusion. No stenosis at the operated levels. 3. Mild right subarticular recess and foraminal narrowing at L2-3 due to a disc protrusion in the inferior aspect of the right foramen. 4. Mild central canal narrowing L3-4.  Electronically Signed   By: Debby Prader M.D.   On: 10/15/2023 10:27 ---- MRI LUMBAR SPINE WITHOUT CONTRAST   L1-2: Stable mild disc bulging with endplate osteophyte formation. No spinal stenosis or nerve root encroachment.   L2-3: Progressive loss of disc height with annular disc bulging and endplate osteophyte formation asymmetric to the right. There is mildly progressive asymmetric narrowing of the right lateral recess and right foramen. Mild facet and ligamentous hypertrophy.   L3-4: Stable loss of disc height with annular disc bulging and endplate osteophytes asymmetric to the left. Mild facet and ligamentous hypertrophy. Stable mild spinal stenosis without nerve root encroachment.   L4-5: The spinal canal and neural foramina are  widely decompressed status post laminectomy and PLIF. No nerve root encroachment.   L5-S1: The spinal canal and neural foramina are well-decompressed status post laminectomy and PLIF. Stable grade 1 anterolisthesis without nerve root encroachment.   IMPRESSION: 1. Compared with the remote MRI from 2016, there is mildly progressive disc degeneration at L2-3 with annular disc bulging and endplate osteophyte formation asymmetric to the right. Right lateral recess and right foraminal narrowing has mildly progressed with potential right L2 nerve root encroachment. 2. 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 3. Stable postsurgical changes at L4-5 and L5-S1 status post laminectomy and PLIF.     Electronically Signed   By: Elsie Perone M.D.   On: 04/07/2021 14:52   He reports that he quit smoking about 35 years ago. His smoking use included cigarettes. He started smoking about 50 years ago. He has a 15 pack-year smoking history. He has never used smokeless tobacco. No results for input(s): HGBA1C, LABURIC in the last 8760 hours.  Objective:  VS:  HT:    WT:   BMI:     BP:   HR: bpm  TEMP: ( )  RESP:  Physical Exam Vitals and nursing note reviewed.  Constitutional:      General: He is not in acute distress.    Appearance: Normal appearance. He is well-developed. He is not ill-appearing.  HENT:     Head: Normocephalic and atraumatic.     Right Ear: External ear normal.     Left Ear: External ear normal.     Nose: Nose normal. No congestion.     Mouth/Throat:     Mouth: Mucous membranes are moist.     Pharynx: Oropharynx is clear.  Eyes:     Extraocular Movements: Extraocular movements intact.     Conjunctiva/sclera: Conjunctivae normal.     Pupils: Pupils are equal, round, and reactive to light.  Neck:     Trachea: No tracheal deviation.  Cardiovascular:     Rate and Rhythm: Normal rate and regular rhythm.     Pulses: Normal pulses.  Pulmonary:      Effort: Pulmonary effort is normal. No respiratory distress.     Breath sounds: Normal breath sounds.  Abdominal:     General: There is no distension.     Palpations: Abdomen is soft.     Tenderness: There is no guarding or rebound.  Musculoskeletal:        General: Tenderness present. No deformity or signs of injury.     Cervical back: Normal range of motion and neck supple.     Right lower leg: No edema.     Left lower leg: No edema.     Comments: Patient has good distal strength without clonus.  Skin:    General: Skin is warm and dry.  Findings: No erythema or rash.  Neurological:     General: No focal deficit present.     Mental Status: He is alert and oriented to person, place, and time.     Cranial Nerves: No cranial nerve deficit.     Sensory: No sensory deficit.     Motor: No weakness or abnormal muscle tone.     Coordination: Coordination normal.     Gait: Gait abnormal.  Psychiatric:        Mood and Affect: Mood normal.        Behavior: Behavior normal.        Thought Content: Thought content normal.     Ortho Exam  Imaging: No results found.  Past Medical/Family/Surgical/Social History: Medications & Allergies reviewed per EMR, new medications updated. Patient Active Problem List   Diagnosis Date Noted   Primary osteoarthritis of first carpometacarpal joint of right hand 12/10/2021   Right Achilles tendinitis 08/13/2020   Abnormal transaminases 01/21/2020   Diarrhea 01/21/2020   Multiple myeloma in remission (HCC) 01/21/2020   S/P autologous bone marrow transplantation (HCC) 01/21/2020   History of total hip replacement 03/03/2017   Unilateral primary osteoarthritis, left hip 09/18/2016   Past Medical History:  Diagnosis Date   Arthritis    Blood dyscrasia    multiples myloma   Blood transfusion without reported diagnosis    Chronic kidney disease    kidney stones   Dental crowns present    loose upper front crown   Diverticulosis    GERD  (gastroesophageal reflux disease)    High cholesterol    History of hiatal hernia    History of kidney stones    Multiple myeloma (HCC)    Numbness of foot    right toe   Family History  Problem Relation Age of Onset   Colon cancer Father 49   Pancreatic cancer Father    Colon polyps Father    Heart disease Father    Kidney Stones Father    Heart disease Brother    Heart disease Brother    Past Surgical History:  Procedure Laterality Date   ANTERIOR CRUCIATE LIGAMENT REPAIR Right    BONE MARROW TRANSPLANT  2019   Autologous-multiple myeloma   CHOLECYSTECTOMY  1997   COLONOSCOPY     EXTRACORPOREAL SHOCK WAVE LITHOTRIPSY Right 04/10/2014   Procedure: EXTRACORPOREAL SHOCK WAVE LITHOTRIPSY (ESWL);  Surgeon: Dallas Silvan, MD;  Location: [facility];  Service: Urology;  Laterality: Right;   LUMBAR FUSION     fusion   LUMBAR LAMINECTOMY  2008   ROTATOR CUFF REPAIR Bilateral 2001,2007   TOTAL HIP ARTHROPLASTY Left 03/03/2017   Procedure: LEFT TOTAL HIP ARTHROPLASTY ANTERIOR APPROACH;  Surgeon: Jerri Kay HERO, MD;  Location: [facility];  Service: Orthopedics;  Laterality: Left;   Social History   Occupational History   Occupation: retired [employer]  Tobacco Use   Smoking status: Former    Current packs/day: 0.00    Average packs/day: 1 pack/day for 15.0 years (15.0 ttl pk-yrs)    Types: Cigarettes    Start date: 80    Quit date: 1990    Years since quitting: 35.1   Smokeless tobacco: Never  Vaping Use   Vaping status: Never Used  Substance and Sexual Activity   Alcohol use: Yes    Comment: 1-2 per week   Drug use: No   Sexual activity: Yes

## 2023-11-26 NOTE — Procedures (Signed)
 Lumbar Epidural Steroid Injection - Interlaminar Approach with Fluoroscopic Guidance  Patient: [name]      Date of Birth: 1956-04-14 MRN: [id number] PCP: Milana Sharper, MD      Visit Date: 11/09/2023   Universal Protocol:     Consent Given By: the patient  Position: PRONE  Additional Comments: Vital signs were monitored before and after the procedure. Patient was prepped and draped in the usual sterile fashion. The correct patient, procedure, and site was verified.   Injection Procedure Details:   Procedure diagnoses: Lumbar radiculopathy [M54.16]   Meds Administered:  Meds ordered this encounter  Medications   methylPREDNISolone  acetate (DEPO-MEDROL ) injection 40 mg     Laterality: Right  Location/Site:  L2-3, this is the same level that was performed in 2023 3 degrees.  Imaging although they labeled at L3-4.  Needle: 3.5 in., 20 ga. Tuohy  Needle Placement: Paramedian epidural  Findings:   -Comments: Excellent flow of contrast into the epidural space.  Procedure Details: Using a paramedian approach from the side mentioned above, the region overlying the inferior lamina was localized under fluoroscopic visualization and the soft tissues overlying this structure were infiltrated with 4 ml. of 1% Lidocaine  without Epinephrine . The Tuohy needle was inserted into the epidural space using a paramedian approach.   The epidural space was localized using loss of resistance along with counter oblique bi-planar fluoroscopic views.  After negative aspirate for air, blood, and CSF, a 2 ml. volume of Isovue -250 was injected into the epidural space and the flow of contrast was observed. Radiographs were obtained for documentation purposes.    The injectate was administered into the level noted above.   Additional Comments:  No complications occurred Dressing: 2 x 2 sterile gauze and Band-Aid    Post-procedure details: Patient was observed during the  procedure. Post-procedure instructions were reviewed.  Patient left the clinic in stable condition.

## 2024-01-24 ENCOUNTER — Other Ambulatory Visit: Payer: Self-pay | Admitting: Physician Assistant

## 2024-01-24 MED ORDER — ONDANSETRON 4 MG PO TBDP: 4.0000 mg | ORAL_TABLET | Freq: Three times a day (TID) | ORAL | 1 refills | Status: AC | PRN

## 2024-02-15 ENCOUNTER — Encounter: Payer: Self-pay | Admitting: Orthopaedic Surgery

## 2024-02-15 ENCOUNTER — Ambulatory Visit: Admitting: Orthopaedic Surgery

## 2024-02-15 DIAGNOSIS — G5601 Carpal tunnel syndrome, right upper limb: Secondary | ICD-10-CM | POA: Diagnosis not present

## 2024-02-15 DIAGNOSIS — G5602 Carpal tunnel syndrome, left upper limb: Secondary | ICD-10-CM | POA: Diagnosis not present

## 2024-02-15 NOTE — Progress Notes (Signed)
 Office Visit Note   Patient: [name]           Date of Birth: [birth year]           MRN: [id number] Visit Date: 02/15/2024              Requested by: James Mcardle, MD [address] [place],  [place] [address] PCP: James Mcardle, MD   Assessment & Plan: Visit Diagnoses:  1. Right carpal tunnel syndrome   2. Left carpal tunnel syndrome     Plan: History of Present Illness [name] is a 68 year old male who presents with numbness in his hands.  He experiences numbness in his hands for the last few months, with the right hand being completely numb upon waking and the left hand partially numb. The numbness improves with movement. He denies sleeping on his hands and usually sleeps on his back. During activities such as weeding or outdoor work, numbness occurs in the right hand, affecting the entire hand rather than specific fingertips. The right hand is more affected than the left. He has not undergone any prior work-up or diagnostic studies and has not tried any treatments. There is no radiation of numbness down or up his arm.  Physical Exam MUSCULOSKELETAL: Slight thenar flattening. Full composite fist and full thumb opposition achieved. Positive Durkin's and Phalen's tests on right hand.  Assessment and Plan Bilateral carpal tunnel syndrome Chronic numbness in the right hand, more severe than the left. Positive Durkin's and Phalen's tests on the right hand. Differential diagnosis considered cervical radiculopathy, but symptoms support carpal tunnel syndrome. Slight thenar flattening observed. - Advise wearing carpal tunnel braces or splints at night for two weeks to assess symptom relief. - Order nerve conduction studies to evaluate severity and rule out additional pathology. - Schedule follow-up appointment for nerve conduction studies.  Follow-Up Instructions: No follow-ups on file.   Orders:  Orders Placed This Encounter  Procedures   Ambulatory referral  to Physical Medicine Rehab   No orders of the defined types were placed in this encounter.     Procedures: No procedures performed   Clinical Data: No additional findings.   Subjective: Chief Complaint  Patient presents with   Left Hand - Numbness   Right Hand - Numbness    HPI  Review of Systems  Constitutional: Negative.   HENT: Negative.    Eyes: Negative.   Respiratory: Negative.    Cardiovascular: Negative.   Gastrointestinal: Negative.   Endocrine: Negative.   Genitourinary: Negative.   Skin: Negative.   Allergic/Immunologic: Negative.   Neurological: Negative.   Hematological: Negative.   Psychiatric/Behavioral: Negative.    All other systems reviewed and are negative.    Objective: Vital Signs: There were no vitals taken for this visit.  Physical Exam Vitals and nursing note reviewed.  Constitutional:      Appearance: He is well-developed.  Pulmonary:     Effort: Pulmonary effort is normal.  Abdominal:     Palpations: Abdomen is soft.  Skin:    General: Skin is warm.  Neurological:     Mental Status: He is alert and oriented to person, place, and time.  Psychiatric:        Behavior: Behavior normal.        Thought Content: Thought content normal.        Judgment: Judgment normal.     Ortho Exam  Specialty Comments:  MRI LUMBAR SPINE WITHOUT CONTRAST   TECHNIQUE:  Multiplanar, multisequence MR imaging of the lumbar spine was performed. No intravenous contrast was administered.   COMPARISON:  Plain films lumbar spine 04/13/2021. MRI lumbar spine 04/07/2021.   FINDINGS: Segmentation:  Standard.   Alignment: Trace retrolisthesis L2 on L3 and L3 on L4. Grade 1 anterolisthesis L5 on S1. Alignment is unchanged.   Vertebrae: No fracture, evidence of discitis, or bone lesion. Status post L4-S1 fusion as seen on the prior MRI.   Conus medullaris and cauda equina: Conus extends to the L1 level. Conus and cauda equina appear normal.    Paraspinal and other soft tissues: Parapelvic renal cysts are again seen.   Disc levels:   T11-12 is imaged in the sagittal plane only. Minimal disc bulging without stenosis is present.   T12-L1: Minimal disc bulge without stenosis.   L1-2: Mild facet arthropathy and minimal disc bulge.  No stenosis.   L2-3: There is a shallow disc bulge. Protrusion in the inferior aspect of the right foramen is again seen. Mild right subarticular recess and foraminal narrowing is unchanged. The left foramen is open.   L3-4: Shallow disc bulge and endplate spur. Mild central canal narrowing is again seen. The foramina are open.   L4-5: Status post fusion.  No stenosis.   L5-S1: Status post fusion.  No stenosis.   IMPRESSION: 1. No change in the appearance of the lumbar spine since the prior MRI. 2. Status post L4-S1 fusion. No stenosis at the operated levels. 3. Mild right subarticular recess and foraminal narrowing at L2-3 due to a disc protrusion in the inferior aspect of the right foramen. 4. Mild central canal narrowing L3-4.     Electronically Signed   By: Etheleen Her M.D.   On: 10/15/2023 10:27 ---- MRI LUMBAR SPINE WITHOUT CONTRAST   L1-2: Stable mild disc bulging with endplate osteophyte formation. No spinal stenosis or nerve root encroachment.   L2-3: Progressive loss of disc height with annular disc bulging and endplate osteophyte formation asymmetric to the right. There is mildly progressive asymmetric narrowing of the right lateral recess and right foramen. Mild facet and ligamentous hypertrophy.   L3-4: Stable loss of disc height with annular disc bulging and endplate osteophytes asymmetric to the left. Mild facet and ligamentous hypertrophy. Stable mild spinal stenosis without nerve root encroachment.   L4-5: The spinal canal and neural foramina are widely decompressed status post laminectomy and PLIF. No nerve root encroachment.   L5-S1: The spinal canal  and neural foramina are well-decompressed status post laminectomy and PLIF. Stable grade 1 anterolisthesis without nerve root encroachment.   IMPRESSION: 1. Compared with the remote MRI from 2016, there is mildly progressive disc degeneration at L2-3 with annular disc bulging and endplate osteophyte formation asymmetric to the right. Right lateral recess and right foraminal narrowing has mildly progressed with potential right L2 nerve root encroachment. 2. 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 3. Stable postsurgical changes at L4-5 and L5-S1 status post laminectomy and PLIF.     Electronically Signed   By: Elmon Hagedorn M.D.   On: 04/07/2021 14:52  Imaging: No results found.   PMFS History: Patient Active Problem List   Diagnosis Date Noted   Primary osteoarthritis of first carpometacarpal joint of right hand 12/10/2021   Right Achilles tendinitis 08/13/2020   Abnormal transaminases 01/21/2020   Diarrhea 01/21/2020   Multiple myeloma in remission (HCC) 01/21/2020   S/P autologous bone marrow transplantation (HCC) 01/21/2020   History of total hip replacement 03/03/2017   Unilateral  primary osteoarthritis, left hip 09/18/2016   Past Medical History:  Diagnosis Date   Arthritis    Blood dyscrasia    multiples myloma   Blood transfusion without reported diagnosis    Chronic kidney disease    kidney stones   Dental crowns present    loose upper front crown   Diverticulosis    GERD (gastroesophageal reflux disease)    High cholesterol    History of hiatal hernia    History of kidney stones    Multiple myeloma (HCC)    Numbness of foot    right toe    Family History  Problem Relation Age of Onset   Colon cancer Father 68   Pancreatic cancer Father    Colon polyps Father    Heart disease Father    Kidney Stones Father    Heart disease Brother    Heart disease Brother     Past Surgical History:  Procedure Laterality Date   ANTERIOR CRUCIATE  LIGAMENT REPAIR Right    BONE MARROW TRANSPLANT  2019   Autologous-multiple myeloma   CHOLECYSTECTOMY  1997   COLONOSCOPY     EXTRACORPOREAL SHOCK WAVE LITHOTRIPSY Right 04/10/2014   Procedure: EXTRACORPOREAL SHOCK WAVE LITHOTRIPSY (ESWL);  Surgeon: Jerl Montgomery, MD;  Location: [facility];  Service: Urology;  Laterality: Right;   LUMBAR FUSION     fusion   LUMBAR LAMINECTOMY  2008   ROTATOR CUFF REPAIR Bilateral 2001,2007   TOTAL HIP ARTHROPLASTY Left 03/03/2017   Procedure: LEFT TOTAL HIP ARTHROPLASTY ANTERIOR APPROACH;  Surgeon: Wes Hamman, MD;  Location: [facility];  Service: Orthopedics;  Laterality: Left;   Social History   Occupational History   Occupation: retired [employer] fundraiser  Tobacco Use   Smoking status: Former    Current packs/day: 0.00    Average packs/day: 1 pack/day for 15.0 years (15.0 ttl pk-yrs)    Types: Cigarettes    Start date: 15    Quit date: 1990    Years since quitting: 35.3   Smokeless tobacco: Never  Vaping Use   Vaping status: Never Used  Substance and Sexual Activity   Alcohol use: Yes    Comment: 1-2 per week   Drug use: No   Sexual activity: Yes

## 2024-02-28 ENCOUNTER — Ambulatory Visit (INDEPENDENT_AMBULATORY_CARE_PROVIDER_SITE_OTHER): Admitting: Physical Medicine and Rehabilitation

## 2024-02-28 DIAGNOSIS — R202 Paresthesia of skin: Secondary | ICD-10-CM | POA: Diagnosis not present

## 2024-02-28 NOTE — Progress Notes (Signed)
 Pain Scale   Average Pain 4 Patient advising she is ambidextrous, he states he has numbness tingling bilaterally to both hands          +Driver, -BT, -Dye Allergies.

## 2024-02-29 NOTE — Procedures (Signed)
 EMG & NCV Findings: Evaluation of the right median motor nerve showed prolonged distal onset latency (6.3 ms) and decreased conduction velocity (Elbow-Wrist, 46 m/s).  The left median (across palm) sensory and the right median (across palm) sensory nerves showed prolonged distal peak latency (Wrist, L3.9, R5.3 ms) and prolonged distal peak latency (Palm, L2.2, R2.4 ms).  The left ulnar sensory and the right ulnar sensory nerves showed prolonged distal peak latency (L3.8, R3.8 ms) and decreased conduction velocity (Wrist-5th Digit, L37, R37 m/s).  All remaining nerves (as indicated in the following tables) were within normal limits.  Left vs. Right side comparison data for the median motor nerve indicates abnormal L-R latency difference (2.5 ms).  The ulnar motor nerve indicates abnormal L-R velocity difference (A Elbow-B Elbow, 47 m/s).  All remaining left vs. right side differences were within normal limits.    All examined muscles (as indicated in the following table) showed no evidence of electrical instability.    Impression: The above electrodiagnostic study is ABNORMAL and reveals evidence of:  a moderate right median nerve entrapment at the wrist (carpal tunnel syndrome) affecting sensory and motor components.   a mild left median nerve entrapment at the wrist (carpal tunnel syndrome) affecting sensory components.   There is no significant electrodiagnostic evidence of any other focal nerve entrapment, brachial plexopathy or cervical radiculopathy.   Recommendations: 1.  Follow-up with referring physician. 2.  Continue current management of symptoms. 3.  Continue use of resting splint at night-time and as needed during the day. 4.  Suggest surgical evaluation.  ___________________________ Collin Deal FAAPMR Board Certified, American Board of Physical Medicine and Rehabilitation    Nerve Conduction Studies Anti Sensory Summary Table   Stim Site NR Peak (ms) Norm Peak (ms) P-T Amp  (V) Norm P-T Amp Site1 Site2 Delta-P (ms) Dist (cm) Vel (m/s) Norm Vel (m/s)  Left Median Acr Palm Anti Sensory (2nd Digit)  30.7C  Wrist    *3.9 <3.6 26.8 >10 Wrist Palm 1.7 0.0    Palm    *2.2 <2.0 18.1         Right Median Acr Palm Anti Sensory (2nd Digit)  28.8C  Wrist    *5.3 <3.6 18.9 >10 Wrist Palm 2.9 0.0    Palm    *2.4 <2.0 8.2         Left Radial Anti Sensory (Base 1st Digit)  30.2C  Wrist    2.2 <3.1 22.5  Wrist Base 1st Digit 2.2 0.0    Right Radial Anti Sensory (Base 1st Digit)  29.6C  Wrist    2.3 <3.1 26.3  Wrist Base 1st Digit 2.3 0.0    Left Ulnar Anti Sensory (5th Digit)  30.7C  Wrist    *3.8 <3.7 19.9 >15.0 Wrist 5th Digit 3.8 14.0 *37 >38  Right Ulnar Anti Sensory (5th Digit)  29.9C  Wrist    *3.8 <3.7 21.9 >15.0 Wrist 5th Digit 3.8 14.0 *37 >38   Motor Summary Table   Stim Site NR Onset (ms) Norm Onset (ms) O-P Amp (mV) Norm O-P Amp Site1 Site2 Delta-0 (ms) Dist (cm) Vel (m/s) Norm Vel (m/s)  Left Median Motor (Abd Poll Brev)  30.3C  Wrist    3.8 <4.2 6.9 >5 Elbow Wrist 4.6 24.0 52 >50  Elbow    8.4  7.3         Right Median Motor (Abd Poll Brev)  29.8C  Wrist    *6.3 <4.2 6.6 >5 Elbow Wrist 5.1 23.5 *46 >  50  Elbow    11.4  6.7         Left Ulnar Motor (Abd Dig Min)  30.5C  Wrist    3.7 <4.2 11.1 >3 B Elbow Wrist 3.9 24.0 62 >53  B Elbow    7.6  9.0  A Elbow B Elbow 1.9 10.0 53 >53  A Elbow    9.5  8.9         Right Ulnar Motor (Abd Dig Min)  29.8C  Wrist    3.1 <4.2 9.7 >3 B Elbow Wrist 4.2 24.0 57 >53  B Elbow    7.3  8.0  A Elbow B Elbow 1.0 10.0 100 >53  A Elbow    8.3  8.0          EMG   Side Muscle Nerve Root Ins Act Fibs Psw Amp Dur Poly Recrt Int Deatra Face Comment  Right Abd Poll Brev Median C8-T1 Nml Nml Nml Nml Nml 0 Nml Nml   Right 1stDorInt Ulnar C8-T1 Nml Nml Nml Nml Nml 0 Nml Nml   Right PronatorTeres Median C6-7 Nml Nml Nml Nml Nml 0 Nml Nml   Right Biceps Musculocut C5-6 Nml Nml Nml Nml Nml 0 Nml Nml     Nerve Conduction  Studies Anti Sensory Left/Right Comparison   Stim Site L Lat (ms) R Lat (ms) L-R Lat (ms) L Amp (V) R Amp (V) L-R Amp (%) Site1 Site2 L Vel (m/s) R Vel (m/s) L-R Vel (m/s)  Median Acr Palm Anti Sensory (2nd Digit)  30.7C  Wrist *3.9 *5.3 1.4 26.8 18.9 29.5 Wrist Palm     Palm *2.2 *2.4 0.2 18.1 8.2 54.7       Radial Anti Sensory (Base 1st Digit)  30.2C  Wrist 2.2 2.3 0.1 22.5 26.3 14.4 Wrist Base 1st Digit     Ulnar Anti Sensory (5th Digit)  30.7C  Wrist *3.8 *3.8 0.0 19.9 21.9 9.1 Wrist 5th Digit *37 *37 0   Motor Left/Right Comparison   Stim Site L Lat (ms) R Lat (ms) L-R Lat (ms) L Amp (mV) R Amp (mV) L-R Amp (%) Site1 Site2 L Vel (m/s) R Vel (m/s) L-R Vel (m/s)  Median Motor (Abd Poll Brev)  30.3C  Wrist 3.8 *6.3 *2.5 6.9 6.6 4.3 Elbow Wrist 52 *46 6  Elbow 8.4 11.4 3.0 7.3 6.7 8.2       Ulnar Motor (Abd Dig Min)  30.5C  Wrist 3.7 3.1 0.6 11.1 9.7 12.6 B Elbow Wrist 62 57 5  B Elbow 7.6 7.3 0.3 9.0 8.0 11.1 A Elbow B Elbow 53 100 *47  A Elbow 9.5 8.3 1.2 8.9 8.0 10.1          Waveforms:

## 2024-03-05 NOTE — Progress Notes (Signed)
 [name] - 68 y.o. male MRN [id number]  Date of birth: [birth year]  Office Visit Note: Visit Date: 02/28/2024 PCP: James Mcardle, MD Referred by: Wes Hamman, MD  Subjective: Chief Complaint  Patient presents with   Right Hand - Numbness, Pain   Left Hand - Numbness, Pain   HPI:  [name] is a 68 y.o. male who comes in today at the request of Dr. Claria Crofts for evaluation and management of chronic, worsening and severe pain, numbness and tingling in the Bilateral upper extremities.  Patient is Right hand dominant. He experiences numbness in his hands for the last few months, with the right hand being completely numb upon waking and the left hand partially numb. The numbness improves with movement. He denies sleeping on his hands and usually sleeps on his back. During activities such as weeding or outdoor work, numbness occurs in the right hand, affecting the entire hand rather than specific fingertips. The right hand is more affected than the left. He has not undergone any prior work-up or diagnostic studies and has not tried any treatments. There is no radiation of numbness down or up his arm.       ROS Otherwise per HPI.  Assessment & Plan: Visit Diagnoses:    ICD-10-CM   1. Paresthesia of skin  R20.2 NCV with EMG (electromyography)      Plan: Impression: The above electrodiagnostic study is ABNORMAL and reveals evidence of:  a moderate right median nerve entrapment at the wrist (carpal tunnel syndrome) affecting sensory and motor components.   a mild left median nerve entrapment at the wrist (carpal tunnel syndrome) affecting sensory components.   There is no significant electrodiagnostic evidence of any other focal nerve entrapment, brachial plexopathy or cervical radiculopathy.   Recommendations: 1.  Follow-up with referring physician. 2.  Continue current management of symptoms. 3.  Continue use of resting splint at night-time and as needed during the  day. 4.  Suggest surgical evaluation.  Meds & Orders: No orders of the defined types were placed in this encounter.   Orders Placed This Encounter  Procedures   NCV with EMG (electromyography)    Follow-up: Return for  Claria Crofts, MD.   Procedures: No procedures performed  EMG & NCV Findings: Evaluation of the right median motor nerve showed prolonged distal onset latency (6.3 ms) and decreased conduction velocity (Elbow-Wrist, 46 m/s).  The left median (across palm) sensory and the right median (across palm) sensory nerves showed prolonged distal peak latency (Wrist, L3.9, R5.3 ms) and prolonged distal peak latency (Palm, L2.2, R2.4 ms).  The left ulnar sensory and the right ulnar sensory nerves showed prolonged distal peak latency (L3.8, R3.8 ms) and decreased conduction velocity (Wrist-5th Digit, L37, R37 m/s).  All remaining nerves (as indicated in the following tables) were within normal limits.  Left vs. Right side comparison data for the median motor nerve indicates abnormal L-R latency difference (2.5 ms).  The ulnar motor nerve indicates abnormal L-R velocity difference (A Elbow-B Elbow, 47 m/s).  All remaining left vs. right side differences were within normal limits.    All examined muscles (as indicated in the following table) showed no evidence of electrical instability.    Impression: The above electrodiagnostic study is ABNORMAL and reveals evidence of:  a moderate right median nerve entrapment at the wrist (carpal tunnel syndrome) affecting sensory and motor components.   a mild left median nerve entrapment at the wrist (carpal tunnel syndrome) affecting sensory  components.   There is no significant electrodiagnostic evidence of any other focal nerve entrapment, brachial plexopathy or cervical radiculopathy.   Recommendations: 1.  Follow-up with referring physician. 2.  Continue current management of symptoms. 3.  Continue use of resting splint at night-time and as  needed during the day. 4.  Suggest surgical evaluation.  ___________________________ Collin Deal FAAPMR Board Certified, American Board of Physical Medicine and Rehabilitation    Nerve Conduction Studies Anti Sensory Summary Table   Stim Site NR Peak (ms) Norm Peak (ms) P-T Amp (V) Norm P-T Amp Site1 Site2 Delta-P (ms) Dist (cm) Vel (m/s) Norm Vel (m/s)  Left Median Acr Palm Anti Sensory (2nd Digit)  30.7C  Wrist    *3.9 <3.6 26.8 >10 Wrist Palm 1.7 0.0    Palm    *2.2 <2.0 18.1         Right Median Acr Palm Anti Sensory (2nd Digit)  28.8C  Wrist    *5.3 <3.6 18.9 >10 Wrist Palm 2.9 0.0    Palm    *2.4 <2.0 8.2         Left Radial Anti Sensory (Base 1st Digit)  30.2C  Wrist    2.2 <3.1 22.5  Wrist Base 1st Digit 2.2 0.0    Right Radial Anti Sensory (Base 1st Digit)  29.6C  Wrist    2.3 <3.1 26.3  Wrist Base 1st Digit 2.3 0.0    Left Ulnar Anti Sensory (5th Digit)  30.7C  Wrist    *3.8 <3.7 19.9 >15.0 Wrist 5th Digit 3.8 14.0 *37 >38  Right Ulnar Anti Sensory (5th Digit)  29.9C  Wrist    *3.8 <3.7 21.9 >15.0 Wrist 5th Digit 3.8 14.0 *37 >38   Motor Summary Table   Stim Site NR Onset (ms) Norm Onset (ms) O-P Amp (mV) Norm O-P Amp Site1 Site2 Delta-0 (ms) Dist (cm) Vel (m/s) Norm Vel (m/s)  Left Median Motor (Abd Poll Brev)  30.3C  Wrist    3.8 <4.2 6.9 >5 Elbow Wrist 4.6 24.0 52 >50  Elbow    8.4  7.3         Right Median Motor (Abd Poll Brev)  29.8C  Wrist    *6.3 <4.2 6.6 >5 Elbow Wrist 5.1 23.5 *46 >50  Elbow    11.4  6.7         Left Ulnar Motor (Abd Dig Min)  30.5C  Wrist    3.7 <4.2 11.1 >3 B Elbow Wrist 3.9 24.0 62 >53  B Elbow    7.6  9.0  A Elbow B Elbow 1.9 10.0 53 >53  A Elbow    9.5  8.9         Right Ulnar Motor (Abd Dig Min)  29.8C  Wrist    3.1 <4.2 9.7 >3 B Elbow Wrist 4.2 24.0 57 >53  B Elbow    7.3  8.0  A Elbow B Elbow 1.0 10.0 100 >53  A Elbow    8.3  8.0          EMG   Side Muscle Nerve Root Ins Act Fibs Psw Amp Dur Poly Recrt Int Deatra Face  Comment  Right Abd Poll Brev Median C8-T1 Nml Nml Nml Nml Nml 0 Nml Nml   Right 1stDorInt Ulnar C8-T1 Nml Nml Nml Nml Nml 0 Nml Nml   Right PronatorTeres Median C6-7 Nml Nml Nml Nml Nml 0 Nml Nml   Right Biceps Musculocut C5-6 Nml Nml Nml Nml Nml 0 Nml Nml  Nerve Conduction Studies Anti Sensory Left/Right Comparison   Stim Site L Lat (ms) R Lat (ms) L-R Lat (ms) L Amp (V) R Amp (V) L-R Amp (%) Site1 Site2 L Vel (m/s) R Vel (m/s) L-R Vel (m/s)  Median Acr Palm Anti Sensory (2nd Digit)  30.7C  Wrist *3.9 *5.3 1.4 26.8 18.9 29.5 Wrist Palm     Palm *2.2 *2.4 0.2 18.1 8.2 54.7       Radial Anti Sensory (Base 1st Digit)  30.2C  Wrist 2.2 2.3 0.1 22.5 26.3 14.4 Wrist Base 1st Digit     Ulnar Anti Sensory (5th Digit)  30.7C  Wrist *3.8 *3.8 0.0 19.9 21.9 9.1 Wrist 5th Digit *37 *37 0   Motor Left/Right Comparison   Stim Site L Lat (ms) R Lat (ms) L-R Lat (ms) L Amp (mV) R Amp (mV) L-R Amp (%) Site1 Site2 L Vel (m/s) R Vel (m/s) L-R Vel (m/s)  Median Motor (Abd Poll Brev)  30.3C  Wrist 3.8 *6.3 *2.5 6.9 6.6 4.3 Elbow Wrist 52 *46 6  Elbow 8.4 11.4 3.0 7.3 6.7 8.2       Ulnar Motor (Abd Dig Min)  30.5C  Wrist 3.7 3.1 0.6 11.1 9.7 12.6 B Elbow Wrist 62 57 5  B Elbow 7.6 7.3 0.3 9.0 8.0 11.1 A Elbow B Elbow 53 100 *47  A Elbow 9.5 8.3 1.2 8.9 8.0 10.1          Waveforms:                     Clinical History: MRI LUMBAR SPINE WITHOUT CONTRAST   TECHNIQUE: Multiplanar, multisequence MR imaging of the lumbar spine was performed. No intravenous contrast was administered.   COMPARISON:  Plain films lumbar spine 04/13/2021. MRI lumbar spine 04/07/2021.   FINDINGS: Segmentation:  Standard.   Alignment: Trace retrolisthesis L2 on L3 and L3 on L4. Grade 1 anterolisthesis L5 on S1. Alignment is unchanged.   Vertebrae: No fracture, evidence of discitis, or bone lesion. Status post L4-S1 fusion as seen on the prior MRI.   Conus medullaris and cauda equina: Conus  extends to the L1 level. Conus and cauda equina appear normal.   Paraspinal and other soft tissues: Parapelvic renal cysts are again seen.   Disc levels:   T11-12 is imaged in the sagittal plane only. Minimal disc bulging without stenosis is present.   T12-L1: Minimal disc bulge without stenosis.   L1-2: Mild facet arthropathy and minimal disc bulge.  No stenosis.   L2-3: There is a shallow disc bulge. Protrusion in the inferior aspect of the right foramen is again seen. Mild right subarticular recess and foraminal narrowing is unchanged. The left foramen is open.   L3-4: Shallow disc bulge and endplate spur. Mild central canal narrowing is again seen. The foramina are open.   L4-5: Status post fusion.  No stenosis.   L5-S1: Status post fusion.  No stenosis.   IMPRESSION: 1. No change in the appearance of the lumbar spine since the prior MRI. 2. Status post L4-S1 fusion. No stenosis at the operated levels. 3. Mild right subarticular recess and foraminal narrowing at L2-3 due to a disc protrusion in the inferior aspect of the right foramen. 4. Mild central canal narrowing L3-4.     Electronically Signed   By: Etheleen Her M.D.   On: 10/15/2023 10:27 ---- MRI LUMBAR SPINE WITHOUT CONTRAST   L1-2: Stable mild disc bulging with endplate osteophyte formation. No spinal stenosis  or nerve root encroachment.   L2-3: Progressive loss of disc height with annular disc bulging and endplate osteophyte formation asymmetric to the right. There is mildly progressive asymmetric narrowing of the right lateral recess and right foramen. Mild facet and ligamentous hypertrophy.   L3-4: Stable loss of disc height with annular disc bulging and endplate osteophytes asymmetric to the left. Mild facet and ligamentous hypertrophy. Stable mild spinal stenosis without nerve root encroachment.   L4-5: The spinal canal and neural foramina are widely decompressed status post laminectomy  and PLIF. No nerve root encroachment.   L5-S1: The spinal canal and neural foramina are well-decompressed status post laminectomy and PLIF. Stable grade 1 anterolisthesis without nerve root encroachment.   IMPRESSION: 1. Compared with the remote MRI from 2016, there is mildly progressive disc degeneration at L2-3 with annular disc bulging and endplate osteophyte formation asymmetric to the right. Right lateral recess and right foraminal narrowing has mildly progressed with potential right L2 nerve root encroachment. 2. 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 3. Stable postsurgical changes at L4-5 and L5-S1 status post laminectomy and PLIF.     Electronically Signed   By: Elmon Hagedorn M.D.   On: 04/07/2021 14:52     Objective:  VS:  HT:    WT:   BMI:     BP:   HR: bpm  TEMP: ( )  RESP:  Physical Exam Musculoskeletal:        General: No tenderness.     Comments: Inspection reveals no atrophy of the bilateral APB or FDI or hand intrinsics. There is no swelling, color changes, allodynia or dystrophic changes. There is 5 out of 5 strength in the bilateral wrist extension, finger abduction and long finger flexion. There is intact sensation to light touch in all dermatomal and peripheral nerve distributions. There is a negative Froment's test bilaterally.  There is a positive Phalen's test bilaterally. There is a negative Hoffmann's test bilaterally.  Skin:    General: Skin is warm and dry.     Findings: No erythema or rash.  Neurological:     General: No focal deficit present.     Mental Status: He is alert and oriented to person, place, and time.     Cranial Nerves: No cranial nerve deficit.     Sensory: No sensory deficit.     Motor: No weakness or abnormal muscle tone.     Coordination: Coordination normal.     Gait: Gait normal.  Psychiatric:        Mood and Affect: Mood normal.        Behavior: Behavior normal.        Thought Content: Thought content  normal.      Imaging: No results found.

## 2024-03-20 ENCOUNTER — Ambulatory Visit (INDEPENDENT_AMBULATORY_CARE_PROVIDER_SITE_OTHER): Admitting: Orthopaedic Surgery

## 2024-03-20 DIAGNOSIS — G5601 Carpal tunnel syndrome, right upper limb: Secondary | ICD-10-CM | POA: Diagnosis not present

## 2024-03-20 DIAGNOSIS — G5602 Carpal tunnel syndrome, left upper limb: Secondary | ICD-10-CM | POA: Insufficient documentation

## 2024-03-20 NOTE — Progress Notes (Signed)
 Office Visit Note   Patient: [name]           Date of Birth: Mar 22, 1956           MRN: [id number] Visit Date: 03/20/2024              Requested by: James Mcardle, MD [address] [place],  [place] [address] PCP: James Mcardle, MD   Assessment & Plan: Visit Diagnoses:  1. Right carpal tunnel syndrome   2. Left carpal tunnel syndrome     Plan: History of Present Illness [name] is a 68 year old male who presents for follow-up regarding moderate carpal tunnel syndrome in the right hand and mild in the left hand. He is accompanied by his wife, [name]. He was referred by another doctor for follow-up on his carpal tunnel syndrome.  He has moderate carpal tunnel syndrome in the right hand and mild in the left hand. The condition is non-emergent, and he is managing the symptoms. He is considering surgical intervention timing, contingent on his wife's health improvement.  Assessment and Plan Carpal Tunnel Syndrome Moderate in right hand, mild in left. Surgery recommended for right hand.  - Patient will reach out to us  when he is ready to schedule surgery.  In the meantime he is helping his wife recover from a partial knee replacement. - Detailed surgical plan discussed.  Follow-Up Instructions: No follow-ups on file.   Orders:  No orders of the defined types were placed in this encounter.  No orders of the defined types were placed in this encounter.    PMFS History: Patient Active Problem List   Diagnosis Date Noted   Right carpal tunnel syndrome 03/20/2024   Left carpal tunnel syndrome 03/20/2024   Primary osteoarthritis of first carpometacarpal joint of right hand 12/10/2021   Right Achilles tendinitis 08/13/2020   Abnormal transaminases 01/21/2020   Diarrhea 01/21/2020   Multiple myeloma in remission (HCC) 01/21/2020   S/P autologous bone marrow transplantation (HCC) 01/21/2020   History of total hip replacement 03/03/2017   Unilateral primary  osteoarthritis, left hip 09/18/2016   Past Medical History:  Diagnosis Date   Arthritis    Blood dyscrasia    multiples myloma   Blood transfusion without reported diagnosis    Chronic kidney disease    kidney stones   Dental crowns present    loose upper front crown   Diverticulosis    GERD (gastroesophageal reflux disease)    High cholesterol    History of hiatal hernia    History of kidney stones    Multiple myeloma (HCC)    Numbness of foot    right toe    Family History  Problem Relation Age of Onset   Colon cancer Father 27   Pancreatic cancer Father    Colon polyps Father    Heart disease Father    Kidney Stones Father    Heart disease Brother    Heart disease Brother     Past Surgical History:  Procedure Laterality Date   ANTERIOR CRUCIATE LIGAMENT REPAIR Right    BONE MARROW TRANSPLANT  2019   Autologous-multiple myeloma   CHOLECYSTECTOMY  1997   COLONOSCOPY     EXTRACORPOREAL SHOCK WAVE LITHOTRIPSY Right 04/10/2014   Procedure: EXTRACORPOREAL SHOCK WAVE LITHOTRIPSY (ESWL);  Surgeon: Jerl Montgomery, MD;  Location: [facility];  Service: Urology;  Laterality: Right;   LUMBAR FUSION     fusion   LUMBAR LAMINECTOMY  2008  ROTATOR CUFF REPAIR Bilateral 2001,2007   TOTAL HIP ARTHROPLASTY Left 03/03/2017   Procedure: LEFT TOTAL HIP ARTHROPLASTY ANTERIOR APPROACH;  Surgeon: Wes Hamman, MD;  Location: [facility];  Service: Orthopedics;  Laterality: Left;   Social History   Occupational History   Occupation: retired [employer]  Tobacco Use   Smoking status: Former    Current packs/day: 0.00    Average packs/day: 1 pack/day for 15.0 years (15.0 ttl pk-yrs)    Types: Cigarettes    Start date: 33    Quit date: 1990    Years since quitting: 35.4   Smokeless tobacco: Never  Vaping Use   Vaping status: Never Used  Substance and Sexual Activity   Alcohol use: Yes    Comment: 1-2 per week   Drug use: No   Sexual activity: Yes

## 2024-08-06 ENCOUNTER — Encounter: Payer: Self-pay | Admitting: Radiology

## 2024-08-08 ENCOUNTER — Ambulatory Visit: Admitting: Orthopaedic Surgery

## 2024-08-14 ENCOUNTER — Encounter: Payer: Self-pay | Admitting: Orthopaedic Surgery

## 2024-09-05 ENCOUNTER — Other Ambulatory Visit: Payer: Self-pay | Admitting: Physician Assistant

## 2024-09-05 MED ORDER — HYDROCODONE-ACETAMINOPHEN 5-325 MG PO TABS: 1.0000 | ORAL_TABLET | Freq: Three times a day (TID) | ORAL | 0 refills | Status: AC | PRN

## 2024-09-05 MED ORDER — ONDANSETRON HCL 4 MG PO TABS: 4.0000 mg | ORAL_TABLET | Freq: Three times a day (TID) | ORAL | 0 refills | Status: AC | PRN

## 2024-09-13 ENCOUNTER — Other Ambulatory Visit: Payer: Self-pay | Admitting: Physician Assistant

## 2024-09-13 DIAGNOSIS — G5601 Carpal tunnel syndrome, right upper limb: Secondary | ICD-10-CM

## 2024-09-20 ENCOUNTER — Ambulatory Visit: Admitting: Physician Assistant

## 2024-09-20 DIAGNOSIS — G5601 Carpal tunnel syndrome, right upper limb: Secondary | ICD-10-CM

## 2024-09-20 NOTE — Progress Notes (Signed)
 Post-Op Visit Note   Patient: [name]           Date of Birth: [birth year]           MRN: [id number] Visit Date: 09/20/2024 PCP: Milana Sharper, MD   Assessment & Plan:  Chief Complaint:  Chief Complaint  Patient presents with   Right Wrist - Follow-up    Right CTR 09/13/2024   Visit Diagnoses:  1. Right carpal tunnel syndrome     Plan: Patient is a pleasant 68 year old gentleman who comes in today 1 week status post right carpal tunnel release 09/13/2024.  Doing well.  Some discomfort at times, however.  He does note paresthesias to the fingertips throughout the median nerve distribution which primarily occurs in the morning.  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  Examination of his right hand reveals a well-healing surgical incision with nylon sutures in place.  No evidence of infection or cellulitis.  Fingers warm well-perfused.  He does have full sensation distally.  Today, his wound was cleaned and recovered.  No heavy lifting or submerging his hand underwater for another 3 weeks.  He may begin nerve gliding exercises.  Follow-up in the next 1 to 2 weeks for suture removal.  Call with concerns or questions.  Follow-Up Instructions: Return in about 12 days (around 10/02/2024) for ok to work in to my schedule.   Orders:  No orders of the defined types were placed in this encounter.  No orders of the defined types were placed in this encounter.   Imaging: No new imaging  PMFS History: Patient Active Problem List   Diagnosis Date Noted   Right carpal tunnel syndrome 03/20/2024   Left carpal tunnel syndrome 03/20/2024   Primary osteoarthritis of first carpometacarpal joint of right hand 12/10/2021   Right Achilles tendinitis 08/13/2020   Abnormal transaminases 01/21/2020   Diarrhea 01/21/2020   Multiple myeloma in remission (HCC) 01/21/2020   S/P autologous bone marrow transplantation (HCC) 01/21/2020   History of  total hip replacement 03/03/2017   Unilateral primary osteoarthritis, left hip 09/18/2016   Past Medical History:  Diagnosis Date   Arthritis    Blood dyscrasia    multiples myloma   Blood transfusion without reported diagnosis    Chronic kidney disease    kidney stones   Dental crowns present    loose upper front crown   Diverticulosis    GERD (gastroesophageal reflux disease)    High cholesterol    History of hiatal hernia    History of kidney stones    Multiple myeloma (HCC)    Numbness of foot    right toe    Family History  Problem Relation Age of Onset   Colon cancer Father 73   Pancreatic cancer Father    Colon polyps Father    Heart disease Father    Kidney Stones Father    Heart disease Brother    Heart disease Brother     Past Surgical History:  Procedure Laterality Date   ANTERIOR CRUCIATE LIGAMENT REPAIR Right    BONE MARROW TRANSPLANT  2019   Autologous-multiple myeloma   CHOLECYSTECTOMY  1997   COLONOSCOPY     EXTRACORPOREAL SHOCK WAVE LITHOTRIPSY Right 04/10/2014   Procedure: EXTRACORPOREAL SHOCK WAVE LITHOTRIPSY (ESWL);  Surgeon: Dallas Silvan, MD;  Location: [facility];  Service: Urology;  Laterality: Right;   LUMBAR FUSION     fusion   LUMBAR LAMINECTOMY  2008  ROTATOR CUFF REPAIR Bilateral 2001,2007   TOTAL HIP ARTHROPLASTY Left 03/03/2017   Procedure: LEFT TOTAL HIP ARTHROPLASTY ANTERIOR APPROACH;  Surgeon: Jerri Kay HERO, MD;  Location: [facility];  Service: Orthopedics;  Laterality: Left;   Social History   Occupational History   Occupation: retired [employer]  Tobacco Use   Smoking status: Former    Current packs/day: 0.00    Average packs/day: 1 pack/day for 15.0 years (15.0 ttl pk-yrs)    Types: Cigarettes    Start date: 9    Quit date: 1990    Years since quitting: 35.9   Smokeless tobacco: Never  Vaping Use   Vaping status: Never Used  Substance and Sexual Activity   Alcohol use: Yes    Comment: 1-2 per week   Drug use: No   Sexual  activity: Yes

## 2024-09-21 ENCOUNTER — Telehealth: Payer: Self-pay | Admitting: Physician Assistant

## 2024-09-21 NOTE — Telephone Encounter (Signed)
 Patient will need to be at [facility] for an oncology appt on 12/30 and needs to be rescheduled to 12/31.  Can you call to reschedule this appt?

## 2024-09-21 NOTE — Telephone Encounter (Signed)
 Spoke with patient and rescheduled

## 2024-09-21 NOTE — Telephone Encounter (Signed)
Tried to call. No answer. Will try again later. 

## 2024-10-02 ENCOUNTER — Encounter: Admitting: Physician Assistant

## 2024-10-03 ENCOUNTER — Ambulatory Visit (INDEPENDENT_AMBULATORY_CARE_PROVIDER_SITE_OTHER): Admitting: Physician Assistant

## 2024-10-03 DIAGNOSIS — G5601 Carpal tunnel syndrome, right upper limb: Secondary | ICD-10-CM

## 2024-10-03 DIAGNOSIS — Z9889 Other specified postprocedural states: Secondary | ICD-10-CM

## 2024-10-03 NOTE — Progress Notes (Signed)
 "  Post-Op Visit Note   Patient: [name]           Date of Birth: [birth year]           MRN: [id number] Visit Date: 10/03/2024 PCP: Milana Sharper, MD   Assessment & Plan:  Chief Complaint:  Chief Complaint  Patient presents with   Right Wrist - Follow-up    Right CTR 09/13/2024   Visit Diagnoses:  1. Right carpal tunnel syndrome   2. S/P carpal tunnel release     Plan: Patient is a pleasant 68 year old gentleman who comes in today approximately 3 weeks status post right carpal tunnel release.  He has been doing well.  He still has some paresthesias but thinks this is from his underlying neuropathy from chemotherapy.  Examination of the right hand reveals a well-healed surgical incision with nylon sutures in place.  No evidence of infection or cellulitis.  Today, sutures were removed and Steri-Strips applied.  He will continue with nerve gliding exercises.  No heavy lifting or submerging his hand underwater for another week.  Follow-up in 1 to 2 weeks for recheck however have discussed with his wife that if everything has healed well he does not need to come in.  Follow-Up Instructions: Return in about 2 weeks (around 10/17/2024).   Orders:  No orders of the defined types were placed in this encounter.  No orders of the defined types were placed in this encounter.   Imaging: No new imaging  PMFS History: Patient Active Problem List   Diagnosis Date Noted   Right carpal tunnel syndrome 03/20/2024   Left carpal tunnel syndrome 03/20/2024   Primary osteoarthritis of first carpometacarpal joint of right hand 12/10/2021   Right Achilles tendinitis 08/13/2020   Abnormal transaminases 01/21/2020   Diarrhea 01/21/2020   Multiple myeloma in remission (HCC) 01/21/2020   S/P autologous bone marrow transplantation (HCC) 01/21/2020   History of total hip replacement 03/03/2017   Unilateral primary osteoarthritis, left hip 09/18/2016   Past Medical History:  Diagnosis Date    Arthritis    Blood dyscrasia    multiples myloma   Blood transfusion without reported diagnosis    Chronic kidney disease    kidney stones   Dental crowns present    loose upper front crown   Diverticulosis    GERD (gastroesophageal reflux disease)    High cholesterol    History of hiatal hernia    History of kidney stones    Multiple myeloma (HCC)    Numbness of foot    right toe    Family History  Problem Relation Age of Onset   Colon cancer Father 17   Pancreatic cancer Father    Colon polyps Father    Heart disease Father    Kidney Stones Father    Heart disease Brother    Heart disease Brother     Past Surgical History:  Procedure Laterality Date   ANTERIOR CRUCIATE LIGAMENT REPAIR Right    BONE MARROW TRANSPLANT  2019   Autologous-multiple myeloma   CHOLECYSTECTOMY  1997   COLONOSCOPY     EXTRACORPOREAL SHOCK WAVE LITHOTRIPSY Right 04/10/2014   Procedure: EXTRACORPOREAL SHOCK WAVE LITHOTRIPSY (ESWL);  Surgeon: Dallas Silvan, MD;  Location: [facility];  Service: Urology;  Laterality: Right;   LUMBAR FUSION     fusion   LUMBAR LAMINECTOMY  2008   ROTATOR CUFF REPAIR Bilateral 2001,2007   TOTAL HIP ARTHROPLASTY Left 03/03/2017   Procedure: LEFT TOTAL HIP ARTHROPLASTY ANTERIOR  APPROACH;  Surgeon: Jerri Kay HERO, MD;  Location: [facility];  Service: Orthopedics;  Laterality: Left;   Social History   Occupational History   Occupation: retired [employer]  Tobacco Use   Smoking status: Former    Current packs/day: 0.00    Average packs/day: 1 pack/day for 15.0 years (15.0 ttl pk-yrs)    Types: Cigarettes    Start date: 43    Quit date: 1990    Years since quitting: 36.0   Smokeless tobacco: Never  Vaping Use   Vaping status: Never Used  Substance and Sexual Activity   Alcohol use: Yes    Comment: 1-2 per week   Drug use: No   Sexual activity: Yes     "
